# Patient Record
Sex: Male | Born: 1955 | Race: White | Hispanic: No | Smoking: Current every day smoker
Health system: Southern US, Community
[De-identification: ages and names within clinical notes are randomized; demographics above are authoritative.]

## PROBLEM LIST (undated history)

## (undated) DIAGNOSIS — Z973 Presence of spectacles and contact lenses: Secondary | ICD-10-CM

## (undated) DIAGNOSIS — K219 Gastro-esophageal reflux disease without esophagitis: Secondary | ICD-10-CM

## (undated) DIAGNOSIS — G43909 Migraine, unspecified, not intractable, without status migrainosus: Secondary | ICD-10-CM

## (undated) DIAGNOSIS — T753XXA Motion sickness, initial encounter: Secondary | ICD-10-CM

## (undated) DIAGNOSIS — C801 Malignant (primary) neoplasm, unspecified: Secondary | ICD-10-CM

## (undated) DIAGNOSIS — Z8489 Family history of other specified conditions: Secondary | ICD-10-CM

## (undated) DIAGNOSIS — I10 Essential (primary) hypertension: Secondary | ICD-10-CM

## (undated) HISTORY — PX: EXCISIONAL HEMORRHOIDECTOMY: SHX1541

## (undated) HISTORY — PX: PROSTATE SURGERY: SHX751

## (undated) HISTORY — PX: COLONOSCOPY: SHX174

## (undated) HISTORY — PX: HERNIA REPAIR: SHX51

---

## 2006-11-13 ENCOUNTER — Ambulatory Visit: Payer: Self-pay | Admitting: Emergency Medicine

## 2007-07-08 ENCOUNTER — Ambulatory Visit: Payer: Self-pay | Admitting: Internal Medicine

## 2007-07-22 ENCOUNTER — Ambulatory Visit: Payer: Self-pay | Admitting: General Practice

## 2008-07-10 DIAGNOSIS — C801 Malignant (primary) neoplasm, unspecified: Secondary | ICD-10-CM

## 2008-07-10 HISTORY — DX: Malignant (primary) neoplasm, unspecified: C80.1

## 2008-07-10 HISTORY — PX: PROSTATE SURGERY: SHX751

## 2008-07-30 ENCOUNTER — Ambulatory Visit: Payer: Self-pay | Admitting: Internal Medicine

## 2009-12-15 ENCOUNTER — Ambulatory Visit: Payer: Self-pay | Admitting: Surgery

## 2010-04-22 ENCOUNTER — Ambulatory Visit: Payer: Self-pay | Admitting: Unknown Physician Specialty

## 2010-04-25 LAB — PATHOLOGY REPORT

## 2010-05-19 ENCOUNTER — Observation Stay: Payer: Self-pay | Admitting: Internal Medicine

## 2010-05-30 ENCOUNTER — Emergency Department: Payer: Self-pay | Admitting: Emergency Medicine

## 2010-06-06 ENCOUNTER — Ambulatory Visit: Payer: Self-pay | Admitting: Surgery

## 2011-06-29 ENCOUNTER — Ambulatory Visit: Payer: Self-pay | Admitting: Urology

## 2011-08-03 ENCOUNTER — Ambulatory Visit: Payer: Self-pay | Admitting: Urology

## 2013-04-14 ENCOUNTER — Ambulatory Visit: Payer: Self-pay | Admitting: Family Medicine

## 2013-09-11 ENCOUNTER — Ambulatory Visit: Payer: Self-pay | Admitting: Otolaryngology

## 2015-01-28 ENCOUNTER — Ambulatory Visit
Admission: EM | Admit: 2015-01-28 | Discharge: 2015-01-28 | Disposition: A | Discharge status: Home / Self Care | Payer: 59 | Attending: Internal Medicine | Admitting: Internal Medicine

## 2015-01-28 ENCOUNTER — Encounter: Payer: Self-pay | Admitting: Registered Nurse

## 2015-01-28 DIAGNOSIS — F17210 Nicotine dependence, cigarettes, uncomplicated: Secondary | ICD-10-CM | POA: Insufficient documentation

## 2015-01-28 DIAGNOSIS — J309 Allergic rhinitis, unspecified: Secondary | ICD-10-CM | POA: Insufficient documentation

## 2015-01-28 DIAGNOSIS — R42 Dizziness and giddiness: Secondary | ICD-10-CM | POA: Diagnosis present

## 2015-01-28 DIAGNOSIS — I10 Essential (primary) hypertension: Secondary | ICD-10-CM | POA: Insufficient documentation

## 2015-01-28 DIAGNOSIS — G43909 Migraine, unspecified, not intractable, without status migrainosus: Secondary | ICD-10-CM | POA: Insufficient documentation

## 2015-01-28 DIAGNOSIS — H6593 Unspecified nonsuppurative otitis media, bilateral: Secondary | ICD-10-CM | POA: Diagnosis not present

## 2015-01-28 DIAGNOSIS — Z79899 Other long term (current) drug therapy: Secondary | ICD-10-CM | POA: Insufficient documentation

## 2015-01-28 HISTORY — DX: Malignant (primary) neoplasm, unspecified: C80.1

## 2015-01-28 HISTORY — DX: Essential (primary) hypertension: I10

## 2015-01-28 MED ORDER — SUMATRIPTAN SUCCINATE 6 MG/0.5ML ~~LOC~~ SOLN
6.0000 mg | Freq: Once | SUBCUTANEOUS | Status: AC
  Administered 2015-01-28: 6 mg via SUBCUTANEOUS

## 2015-01-28 NOTE — Discharge Instructions (Signed)
Next dose of sumatriptan in 12 hours at 0542am Rest, hydrate  Migraine Headache A migraine headache is an intense, throbbing pain on one or both sides of your head. A migraine can last for 30 minutes to several hours. CAUSES  The exact cause of a migraine headache is not always known. However, a migraine may be caused when nerves in the brain become irritated and release chemicals that cause inflammation. This causes pain. Certain things may also trigger migraines, such as:  Alcohol.  Smoking.  Stress.  Menstruation.  Aged cheeses.  Foods or drinks that contain nitrates, glutamate, aspartame, or tyramine.  Lack of sleep.  Chocolate.  Caffeine.  Hunger.  Physical exertion.  Fatigue.  Medicines used to treat chest pain (nitroglycerine), birth control pills, estrogen, and some blood pressure medicines. SIGNS AND SYMPTOMS  Pain on one or both sides of your head.  Pulsating or throbbing pain.  Severe pain that prevents daily activities.  Pain that is aggravated by any physical activity.  Nausea, vomiting, or both.  Dizziness.  Pain with exposure to bright lights, loud noises, or activity.  General sensitivity to bright lights, loud noises, or smells. Before you get a migraine, you may get warning signs that a migraine is coming (aura). An aura may include:  Seeing flashing lights.  Seeing bright spots, halos, or zigzag lines.  Having tunnel vision or blurred vision.  Having feelings of numbness or tingling.  Having trouble talking.  Having muscle weakness. DIAGNOSIS  A migraine headache is often diagnosed based on:  Symptoms.  Physical exam.  A CT scan or MRI of your head. These imaging tests cannot diagnose migraines, but they can help rule out other causes of headaches. TREATMENT Medicines may be given for pain and nausea. Medicines can also be given to help prevent recurrent migraines.  HOME CARE INSTRUCTIONS  Only take over-the-counter or  prescription medicines for pain or discomfort as directed by your health care provider. The use of long-term narcotics is not recommended.  Lie down in a dark, quiet room when you have a migraine.  Keep a journal to find out what may trigger your migraine headaches. For example, write down:  What you eat and drink.  How much sleep you get.  Any change to your diet or medicines.  Limit alcohol consumption.  Quit smoking if you smoke.  Get 7-9 hours of sleep, or as recommended by your health care provider.  Limit stress.  Keep lights dim if bright lights bother you and make your migraines worse. SEEK IMMEDIATE MEDICAL CARE IF:   Your migraine becomes severe.  You have a fever.  You have a stiff neck.  You have vision loss.  You have muscular weakness or loss of muscle control.  You start losing your balance or have trouble walking.  You feel faint or pass out.  You have severe symptoms that are different from your first symptoms. MAKE SURE YOU:   Understand these instructions.  Will watch your condition.  Will get help right away if you are not doing well or get worse. Document Released: 06/26/2005 Document Revised: 11/10/2013 Document Reviewed: 03/03/2013 [facility] Patient Information 2015 [place], [place]. This information is not intended to replace advice given to you by your health care provider. Make sure you discuss any questions you have with your health care provider. Hemorrhagic Stroke A hemorrhagic stroke occurs when a blood vessel in the brain leaks or bursts. Areas of the brain that should receive blood, oxygen, and nutrients from  the damaged blood vessel are deprived of blood flow. This causes areas of the brain to become damaged. Damage also occurs to areas of the brain where the leaked blood accumulates and presses on normal tissue. This is a medical emergency. This can cause permanent damage and loss of brain function. CAUSES  A hemorrhagic stroke is  caused by a decrease of oxygen supply to an area of your brain. It is the result of a blood vessel that leaks or ruptures. The leaking or rupturing blood vessel occurs due to one of the following conditions:  A ballooning of a weak section in a blood vessel (aneurysm).  Hardened, thin blood vessels. Blood vessel walls lined with plaque becoming thin and hardened. These hardened, thin artery walls can crack open and allow blood to flow out of the blood vessel.  An abnormal formation of a blood vessel (arteriovenous malformation). This condition results in an abnormal tangle of thin-walled blood vessels. Once the blood vessel ruptures, bleeding occurs. The blood from the ruptured blood vessel accumulates and compresses the surrounding brain tissue. Hemorrhagic strokes are classified as to the location of the bleed. If the bleeding occurs within the brain tissue, the condition is called an intracerebral hemorrhage. If the bleeding occurs in the area between the brain and the thin tissues that cover the brain, the condition is called a subarachnoid hemorrhage.  RISK FACTORS  High blood pressure (hypertension).  Abnormal blood vessels present since birth.  Bleeding disorders, such as hemophilia, sickle cell disease, or liver disease.  The blood becoming too thin while taking blood thinners (anticoagulants).  Smoking.  Excessive alcohol use.  Use of illicit drugs (especially cocaine or methamphetamine). SYMPTOMS   Sudden, severe headache with no known cause. The headache is often described as the worst headache ever experienced.  Nausea or vomiting, especially when combined with other symptoms such as a headache.  Sudden weakness or numbness of the face, arm, or leg, especially on one side of the body.  Sudden trouble walking or difficulty moving the legs.  Sudden confusion.  Sudden personality changes.  Trouble speaking (aphasia) or understanding.  Difficulty swallowing.  Sudden  trouble seeing in one or both eyes.  Double vision.  Dizziness.  Loss of balance or coordination.  Intolerance to light.  Stiff neck. DIAGNOSIS  Your health care provider will often suspect a hemorrhagic stroke based on your symptoms, history, and exam. A CT scan of the brain is usually performed. This is done to confirm the presence of bleeding in the brain, to look for causes, and to determine severity. Other tests may be done, including:  An MRI.  Angiography.  Blood tests. TREATMENT  The goals of treatment are to try to stop the bleeding, control pressure in the brain, and relieve symptoms.  Medicines may be given to:  Lower blood pressure (antihypertensives).  Relieve pain (analgesics).  Relieve nausea or vomiting.  Stop or prevent seizures.  Prevent the blood vessels in the brain from going into spasm in response to the presence of bleeding.  Other medicines, blood products, or vitamin K may also be given to control the bleeding.  If there is a collection of blood putting pressure on your brain, or if the blood vessel continues to bleed, surgery may be required.  Surgery may also be needed if tests reveal that there are other problems within the blood vessels of the brain that put you at an elevated risk for another bleeding event in the future. Further treatment depends  on the duration, severity, and cause of your symptoms. Physical, speech, and occupational therapists will assess you and work to improve any functions impaired by the stroke. Measures will be taken to prevent short-term and long-term complications, including infection from breathing foreign material into the lungs (aspiration pneumonia), blood clots in the legs, bedsores, and falls. HOME CARE INSTRUCTIONS   Take medicines only as instructed by your health care provider.  If swallow studies have determined that your swallowing reflex is present, you should eat healthy foods. Including 5 or more  servings of fruits and vegetables a day may reduce the risk of stroke. Foods may need to be a certain consistency (soft or pureed), or small bites may need to be taken in order to avoid aspirating or choking. Certain dietary changes may be advised to address high blood pressure, high cholesterol, diabetes, or obesity.  Food choices that are low in sodium, saturated fat, trans fat, and cholesterol are recommended to manage high blood pressure.  Food choies that are high in fiber, and low in saturated fat, trans fat, and cholesterol may control cholesterol levels.  Controlling carbohydrates and sugar intake is recommended to manage diabetes.  Reducing calorie intake and making food choices that are low in sodium, saturated fat, trans fat, and cholesterol are recommended to manage obesity.  Maintain a healthy weight.  Stay physically active. It is recommended that you get at least 30 minutes of activity on most or all days.  Limit alcohol use. Moderate alcohol use is considered to be:  No more than 2 drinks each day for men.  No more than 1 drink each day for nonpregnant women.  Stop drug abuse.  Manage any other health care conditions you may have, if applicable.  A safe home environment is important to reduce the risk of falls. Your health care provider may arrange for specialists to evaluate your home. Having grab bars in the bedroom and bathroom is often important. Your health care provider may arrange for special equipment to be used at home, such as raised toilets and a seat for the shower.  Physical, occupational, and speech therapy. Ongoing therapy may be needed to maximize your recovery after a stroke. If you have been advised to use a walker or a cane, use it at all times. Be sure to keep your therapy appointments.  Follow all instructions for follow-up with your health care provider. This is very important. This includes any referrals, physical therapy, rehabilitation, and  laboratory tests. Proper follow-up can prevent another stroke from occurring. SEEK MEDICAL CARE IF:  You have personality changes.  You have difficulty swallowing.  You are seeing double.  You have dizziness.  You have a fever.  You have skin breakdown. SEEK IMMEDIATE MEDICAL CARE IF:   You have a sudden, severe headache with no known cause.  You have sudden nausea or vomiting with a severe headache.  You have sudden weakness or numbness of the face, arm, or leg, especially on one side of the body.  You have sudden trouble walking or difficulty moving arms or legs.  You have sudden confusion.  You have trouble speaking (aphasia) or understanding.  You have sudden trouble seeing in one or both eyes.  You have a sudden loss of balance or coordination.  You have a stiff neck.  You have difficulty breathing.  You have a partial or total loss of consciousness. Any of these symptoms may represent a serious problem that is an emergency. Do not wait to  see if the symptoms will go away. Get medical help at once. Call your local emergency services (911 in [place] Do not drive yourself to the hospital. Document Released: 12/14/2009 Document Revised: 11/10/2013 Document Reviewed: 02/04/2012 [facility] Patient Information 2015 [place], [place]. This information is not intended to replace advice given to you by your health care provider. Make sure you discuss any questions you have with your health care provider. Ischemic Stroke A stroke (cerebrovascular accident) is the sudden death of brain tissue. It is a medical emergency. A stroke can cause permanent loss of brain function. This can cause problems with different parts of your body. A transient ischemic attack (TIA) is different because it does not cause permanent damage. A TIA is a short-lived problem of poor blood flow affecting a part of the brain. A TIA is also a serious problem because having a TIA greatly increases the chances of  having a stroke. When symptoms first develop, you cannot know if the problem might be a stroke or a TIA. CAUSES  A stroke is caused by a decrease of oxygen supply to an area of your brain. It is usually the result of a small blood clot or collection of cholesterol or fat (plaque) that blocks blood flow in the brain. A stroke can also be caused by blocked or damaged carotid arteries.  RISK FACTORS  High blood pressure (hypertension).  High cholesterol.  Diabetes mellitus.  Heart disease.  The buildup of plaque in the blood vessels (peripheral artery disease or atherosclerosis).  The buildup of plaque in the blood vessels providing blood and oxygen to the brain (carotid artery stenosis).  An abnormal heart rhythm (atrial fibrillation).  Obesity.  Smoking.  Taking oral contraceptives (especially in combination with smoking).  Physical inactivity.  A diet high in fats, salt (sodium), and calories.  Alcohol use.  Use of illegal drugs (especially cocaine and methamphetamine).  Being African American.  Being over the age of 65.  Family history of stroke.  Previous history of blood clots, stroke, TIA, or heart attack.  Sickle cell disease. SYMPTOMS  These symptoms usually develop suddenly, or may be newly present upon awakening from sleep:  Sudden weakness or numbness of the face, arm, or leg, especially on one side of the body.  Sudden trouble walking or difficulty moving arms or legs.  Sudden confusion.  Sudden personality changes.  Trouble speaking (aphasia) or understanding.  Difficulty swallowing.  Sudden trouble seeing in one or both eyes.  Double vision.  Dizziness.  Loss of balance or coordination.  Sudden severe headache with no known cause.  Trouble reading or writing. DIAGNOSIS  Your health care provider can often determine the presence or absence of a stroke based on your symptoms, history, and physical exam. Computed tomography (CT) of the  brain is usually performed to confirm the stroke, determine causes, and determine stroke severity. Other tests may be done to find the cause of the stroke. These tests may include:  Electrocardiography.  Continuous heart monitoring.  Echocardiography.  Carotid ultrasonography.  Magnetic resonance imaging (MRI).  A scan of the brain circulation.  Blood tests. PREVENTION  The risk of a stroke can be decreased by appropriately treating high blood pressure, high cholesterol, diabetes, heart disease, and obesity and by quitting smoking, limiting alcohol, and staying physically active. TREATMENT  Time is of the essence. It is important to seek treatment at the first sign of these symptoms because you may receive a medicine to dissolve the clot (thrombolytic) that cannot  be given if too much time has passed since your symptoms began. Even if you do not know when your symptoms began, get treatment as soon as possible as there are other treatment options available including oxygen, intravenous (IV) fluids, and medicines to thin the blood (anticoagulants). Treatment of stroke depends on the duration, severity, and cause of your symptoms. Medicines and dietary changes may be used to address diabetes, high blood pressure, and other risk factors. Physical, speech, and occupational therapists will assess you and work with you to improve any functions impaired by the stroke. Measures will be taken to prevent short-term and long-term complications, including infection from breathing foreign material into the lungs (aspiration pneumonia), blood clots in the legs, bedsores, and falls. Rarely, surgery may be needed to remove large blood clots or to open up blocked arteries. HOME CARE INSTRUCTIONS   Take medicines only as directed by your health care provider. Follow the directions carefully. Medicines may be used to control risk factors for a stroke. Be sure you understand all your medicine instructions.  You  may be told to take a medicine to thin the blood, such as aspirin or the anticoagulant warfarin. Warfarin needs to be taken exactly as instructed.  Too much and too little warfarin are both dangerous. Too much warfarin increases the risk of bleeding. Too little warfarin continues to allow the risk for blood clots. While taking warfarin, you will need to have regular blood tests to measure your blood clotting time. These blood tests usually include both the PT and INR tests. The PT and INR results allow your health care provider to adjust your dose of warfarin. The dose can change for many reasons. It is critically important that you take warfarin exactly as prescribed, and that you have your PT and INR levels drawn exactly as directed.  Many foods, especially foods high in vitamin K, can interfere with warfarin and affect the PT and INR results. Foods high in vitamin K include spinach, kale, broccoli, cabbage, collard and turnip greens, brussels sprouts, peas, cauliflower, seaweed, and parsley, as well as beef and pork liver, green tea, and soybean oil. You should eat a consistent amount of foods high in vitamin K. Avoid major changes in your diet, or notify your health care provider before changing your diet. Arrange a visit with a dietitian to answer your questions.  Many medicines can interfere with warfarin and affect the PT and INR results. You must tell your health care provider about any and all medicines you take. This includes all vitamins and supplements. Be especially cautious with aspirin and anti-inflammatory medicines. Do not take or discontinue any prescribed or over-the-counter medicine except on the advice of your health care provider or pharmacist.  Warfarin can have side effects, such as excessive bruising or bleeding. You will need to hold pressure over cuts for longer than usual. Your health care provider or pharmacist will discuss other potential side effects.  Avoid sports or  activities that may cause injury or bleeding.  Be mindful when shaving, flossing your teeth, or handling sharp objects.  Alcohol can change the body's ability to handle warfarin. It is best to avoid alcoholic drinks or consume only very small amounts while taking warfarin. Notify your health care provider if you change your alcohol intake.  Notify your dentist or other health care providers before procedures.  If swallow studies have determined that your swallowing reflex is present, you should eat healthy foods. Including 5 or more servings of fruits and  vegetables a day may reduce the risk of stroke. Foods may need to be a certain consistency (soft or pureed), or small bites may need to be taken in order to avoid aspirating or choking. Certain dietary changes may be advised to address high blood pressure, high cholesterol, diabetes, or obesity.  Food choices that are low in sodium, saturated fat, trans fat, and cholesterol are recommended to manage high blood pressure.  Food choies that are high in fiber, and low in saturated fat, trans fat, and cholesterol may control cholesterol levels.  Controlling carbohydrates and sugar intake is recommended to manage diabetes.  Reducing calorie intake and making food choices that are low in sodium, saturated fat, trans fat, and cholesterol are recommended to manage obesity.  Maintain a healthy weight.  Stay physically active. It is recommended that you get at least 30 minutes of activity on all or most days.  Do not use any tobacco products including cigarettes, chewing tobacco, or electronic cigarettes.  Limit alcohol use even if you are not taking warfarin. Moderate alcohol use is considered to be:  No more than 2 drinks each day for men.  No more than 1 drink each day for nonpregnant women.  Home safety. A safe home environment is important to reduce the risk of falls. Your health care provider may arrange for specialists to evaluate your  home. Having grab bars in the bedroom and bathroom is often important. Your health care provider may arrange for equipment to be used at home, such as raised toilets and a seat for the shower.  Physical, occupational, and speech therapy. Ongoing therapy may be needed to maximize your recovery after a stroke. If you have been advised to use a walker or a cane, use it at all times. Be sure to keep your therapy appointments.  Follow all instructions for follow-up with your health care provider. This is very important. This includes any referrals, physical therapy, rehabilitation, and lab tests. Proper follow-up can prevent another stroke from occurring. SEEK MEDICAL CARE IF:  You have personality changes.  You have difficulty swallowing.  You are seeing double.  You have dizziness.  You have a fever.  You have skin breakdown. SEEK IMMEDIATE MEDICAL CARE IF:  Any of these symptoms may represent a serious problem that is an emergency. Do not wait to see if the symptoms will go away. Get medical help right away. Call your local emergency services (911 in [place] Do not drive yourself to the hospital.  You have sudden weakness or numbness of the face, arm, or leg, especially on one side of the body.  You have sudden trouble walking or difficulty moving arms or legs.  You have sudden confusion.  You have trouble speaking (aphasia) or understanding.  You have sudden trouble seeing in one or both eyes.  You have a loss of balance or coordination.  You have a sudden, severe headache with no known cause.  You have new chest pain or an irregular heartbeat.  You have a partial or total loss of consciousness. Document Released: 06/26/2005 Document Revised: 11/10/2013 Document Reviewed: 02/04/2012 [facility] Patient Information 2015 [place], [place]. This information is not intended to replace advice given to you by your health care provider. Make sure you discuss any questions you have with your  health care provider. Transient Ischemic Attack A transient ischemic attack (TIA) is a "warning stroke" that causes stroke-like symptoms. Unlike a stroke, a TIA does not cause permanent damage to the brain. The symptoms of  a TIA can happen very fast and do not last long. It is important to know the symptoms of a TIA and what to do. This can help prevent a major stroke or death. CAUSES   A TIA is caused by a temporary blockage in an artery in the brain or neck (carotid artery). The blockage does not allow the brain to get the blood supply it needs and can cause different symptoms. The blockage can be caused by either:  A blood clot.  Fatty buildup (plaque) in a neck or brain artery. RISK FACTORS  High blood pressure (hypertension).  High cholesterol.  Diabetes mellitus.  Heart disease.  The build up of plaque in the blood vessels (peripheral artery disease or atherosclerosis).  The build up of plaque in the blood vessels providing blood and oxygen to the brain (carotid artery stenosis).  An abnormal heart rhythm (atrial fibrillation).  Obesity.  Smoking.  Taking oral contraceptives (especially in combination with smoking).  Physical inactivity.  A diet high in fats, salt (sodium), and calories.  Alcohol use.  Use of illegal drugs (especially cocaine and methamphetamine).  Being male.  Being African American.  Being over the age of 3.  Family history of stroke.  Previous history of blood clots, stroke, TIA, or heart attack.  Sickle cell disease. SYMPTOMS  TIA symptoms are the same as a stroke but are temporary. These symptoms usually develop suddenly, or may be newly present upon awakening from sleep:  Sudden weakness or numbness of the face, arm, or leg, especially on one side of the body.  Sudden trouble walking or difficulty moving arms or legs.  Sudden confusion.  Sudden personality changes.  Trouble speaking (aphasia) or understanding.  Difficulty  swallowing.  Sudden trouble seeing in one or both eyes.  Double vision.  Dizziness.  Loss of balance or coordination.  Sudden severe headache with no known cause.  Trouble reading or writing.  Loss of bowel or bladder control.  Loss of consciousness. DIAGNOSIS  Your caregiver may be able to determine the presence or absence of a TIA based on your symptoms, history, and physical exam. Computed tomography (CT scan) of the brain is usually performed to help identify a TIA. Other tests may be done to diagnose a TIA. These tests may include:  Electrocardiography.  Continuous heart monitoring.  Echocardiography.  Carotid ultrasonography.  Magnetic resonance imaging (MRI).  A scan of the brain circulation.  Blood tests. PREVENTION  The risk of a TIA can be decreased by appropriately treating high blood pressure, high cholesterol, diabetes, heart disease, and obesity and by quitting smoking, limiting alcohol, and staying physically active. TREATMENT  Time is of the essence. Since the symptoms of TIA are the same as a stroke, it is important to seek treatment as soon as possible because you may need a medicine to dissolve the clot (thrombolytic) that cannot be given if too much time has passed. Treatment options vary. Treatment options may include rest, oxygen, intravenous (IV) fluids, and medicines to thin the blood (anticoagulants). Medicines and diet may be used to address diabetes, high blood pressure, and other risk factors. Measures will be taken to prevent short-term and long-term complications, including infection from breathing foreign material into the lungs (aspiration pneumonia), blood clots in the legs, and falls. Treatment options include procedures to either remove plaque in the carotid arteries or dilate carotid arteries that have narrowed due to plaque. Those procedures are:  Carotid endarterectomy.  Carotid angioplasty and stenting. HOME CARE INSTRUCTIONS  Take  all medicines prescribed by your caregiver. Follow the directions carefully. Medicines may be used to control risk factors for a stroke. Be sure you understand all your medicine instructions.  You may be told to take aspirin or the anticoagulant warfarin. Warfarin needs to be taken exactly as instructed.  Taking too much or too little warfarin is dangerous. Too much warfarin increases the risk of bleeding. Too little warfarin continues to allow the risk for blood clots. While taking warfarin, you will need to have regular blood tests to measure your blood clotting time. A PT blood test measures how long it takes for blood to clot. Your PT is used to calculate another value called an INR. Your PT and INR help your caregiver to adjust your dose of warfarin. The dose can change for many reasons. It is critically important that you take warfarin exactly as prescribed.  Many foods, especially foods high in vitamin K can interfere with warfarin and affect the PT and INR. Foods high in vitamin K include spinach, kale, broccoli, cabbage, collard and turnip greens, brussels sprouts, peas, cauliflower, seaweed, and parsley as well as beef and pork liver, green tea, and soybean oil. You should eat a consistent amount of foods high in vitamin K. Avoid major changes in your diet, or notify your caregiver before changing your diet. Arrange a visit with a dietitian to answer your questions.  Many medicines can interfere with warfarin and affect the PT and INR. You must tell your caregiver about any and all medicines you take, this includes all vitamins and supplements. Be especially cautious with aspirin and anti-inflammatory medicines. Do not take or discontinue any prescribed or over-the-counter medicine except on the advice of your caregiver or pharmacist.  Warfarin can have side effects, such as excessive bruising or bleeding. You will need to hold pressure over cuts for longer than usual. Your caregiver or  pharmacist will discuss other potential side effects.  Avoid sports or activities that may cause injury or bleeding.  Be mindful when shaving, flossing your teeth, or handling sharp objects.  Alcohol can change the body's ability to handle warfarin. It is best to avoid alcoholic drinks or consume only very small amounts while taking warfarin. Notify your caregiver if you change your alcohol intake.  Notify your dentist or other caregivers before procedures.  Eat a diet that includes 5 or more servings of fruits and vegetables each day. This may reduce the risk of stroke. Certain diets may be prescribed to address high blood pressure, high cholesterol, diabetes, or obesity.  A low-sodium, low-saturated fat, low-trans fat, low-cholesterol diet is recommended to manage high blood pressure.  A low-saturated fat, low-trans fat, low-cholesterol, and high-fiber diet may control cholesterol levels.  A controlled-carbohydrate, controlled-sugar diet is recommended to manage diabetes.  A reduced-calorie, low-sodium, low-saturated fat, low-trans fat, low-cholesterol diet is recommended to manage obesity.  Maintain a healthy weight.  Stay physically active. It is recommended that you get at least 30 minutes of activity on most or all days.  Do not smoke.  Limit alcohol use even if you are not taking warfarin. Moderate alcohol use is considered to be:  No more than 2 drinks each day for men.  No more than 1 drink each day for nonpregnant women.  Stop drug abuse.  Home safety. A safe home environment is important to reduce the risk of falls. Your caregiver may arrange for specialists to evaluate your home. Having grab bars in the bedroom and bathroom is  often important. Your caregiver may arrange for equipment to be used at home, such as raised toilets and a seat for the shower.  Follow all instructions for follow-up with your caregiver. This is very important. This includes any referrals and  lab tests. Proper follow up can prevent a stroke or another TIA from occurring. SEEK MEDICAL CARE IF:  You have personality changes.  You have difficulty swallowing.  You are seeing double.  You have dizziness.  You have a fever.  You have skin breakdown. SEEK IMMEDIATE MEDICAL CARE IF:  Any of these symptoms may represent a serious problem that is an emergency. Do not wait to see if the symptoms will go away. Get medical help right away. Call your local emergency services (911 in [place] Do not drive yourself to the hospital.  You have sudden weakness or numbness of the face, arm, or leg, especially on one side of the body.  You have sudden trouble walking or difficulty moving arms or legs.  You have sudden confusion.  You have trouble speaking (aphasia) or understanding.  You have sudden trouble seeing in one or both eyes.  You have a loss of balance or coordination.  You have a sudden, severe headache with no known cause.  You have new chest pain or an irregular heartbeat.  You have a partial or total loss of consciousness. MAKE SURE YOU:   Understand these instructions.  Will watch your condition.  Will get help right away if you are not doing well or get worse. Document Released: 04/05/2005 Document Revised: 07/01/2013 Document Reviewed: 10/01/2013 [facility] Patient Information 2015 [place], [place]. This information is not intended to replace advice given to you by your health care provider. Make sure you discuss any questions you have with your health care provider.

## 2015-01-28 NOTE — ED Notes (Signed)
States had "dizzy spell" this morning. Occasional left lateral chest pain. + lightheaded and describes periods of TIA episodes over last 2 years. A&Ox3

## 2015-01-28 NOTE — ED Provider Notes (Signed)
CSN: [id number]     Arrival date & time 01/28/15  1649 History   First MD Initiated Contact with Patient 01/28/15 1729     Chief Complaint  Patient presents with  . 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  History of migraines that cause "spacing out", no pain in forehead, dysphasia (difficulty finding words/slow to answer questions".  Typically rests and takes imitrex nasal but did not have with him at work today.  Drove himself to urgent care and girlfriend met him here.  Stated current symptoms started about 30 minutes prior to arrival at urgent care.  First episode 0800 this am felt like vacuum, empty feeling in head at 0800 along with left foot numbness this am for a couple of seconds prior to eating breakfast of susage, egg, cheese and mayonnaise on toast and then going to work.  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  Has not eaten dinner yet tonight.  1PPD smoker since age 57.  Chronic back pain left low back and right hand dominant.  Denied arm/leg weakness.  Has reported right eye symptoms in the past with migraine of scintillating lights crescent pattern right eye but has not had those symptoms today  Hx prostate cancer/prostatectomy, HTN, GERD, tobacco use  Patient is a 59 y.o. male presenting with dizziness. The history is provided by the patient and a friend.  Dizziness Quality:  Lightheadedness Severity:  Mild Onset quality:  Sudden Duration:  1 hour Timing:  Intermittent Progression:  Resolved Chronicity:  Recurrent Context: not with bowel movement, not with ear pain, not with eye movement, not with inactivity, not with loss of consciousness and not with medication   Relieved by:  Lying down Worsened by:  Movement Associated symptoms: chest pain   Associated symptoms: no blood in stool, no  diarrhea, no headaches, no hearing loss, no nausea, no palpitations, no shortness of breath, no syncope, no tinnitus, no vision changes, no vomiting and no weakness   Risk factors: heart disease   Risk factors: no anemia, no hx of stroke, no hx of vertigo, no Meniere's disease, no multiple medications and no new medications     Past Medical History  Diagnosis Date  . Hypertension   . Cancer    Past Surgical History  Procedure Laterality Date  . Hernia repair     Family History  Problem Relation Age of Onset  . Parkinson's disease Father    History  Substance Use Topics  . Smoking status: Current Every Day Smoker -- 1.00 packs/day  . Smokeless tobacco: Not on file  . Alcohol Use: No    Review of Systems  Constitutional: Negative for fever, chills, diaphoresis, activity change, appetite change and fatigue.  HENT: Positive for postnasal drip. Negative for congestion, dental problem, drooling, ear discharge, ear pain, facial swelling, hearing loss, rhinorrhea, sinus pressure, sneezing, sore throat, tinnitus, trouble swallowing and voice change.   Eyes: Negative for photophobia, pain, discharge, redness, itching and visual disturbance.  Respiratory: Negative for cough, chest tightness, shortness of breath, wheezing and stridor.   Cardiovascular: Positive for chest pain. Negative for palpitations, leg swelling and syncope.  Gastrointestinal: Negative for nausea, vomiting, diarrhea and blood in stool.  Endocrine: Negative for cold intolerance and heat intolerance.  Genitourinary: Negative for dysuria and hematuria.  Musculoskeletal: Positive for myalgias and back pain. Negative for joint swelling, arthralgias, gait problem, neck pain and neck stiffness.  Skin: Negative for color change, pallor, rash and wound.  Allergic/Immunologic: Positive for environmental allergies. Negative for food allergies.  Neurological: Positive for speech difficulty and light-headedness. Negative for  dizziness, tremors, seizures, syncope, facial asymmetry, weakness, numbness and headaches.  Hematological: Negative for adenopathy. Does not bruise/bleed easily.  Psychiatric/Behavioral: Positive for decreased concentration. Negative for hallucinations, behavioral problems, confusion, sleep disturbance, self-injury, dysphoric mood and agitation. The patient is not nervous/anxious and is not hyperactive.     Allergies  Review of patient's allergies indicates no known allergies.  Home Medications   Prior to Admission medications   Medication Sig Start Date End Date Taking? Authorizing Provider  hydrochlorothiazide (HYDRODIURIL) 25 MG tablet Take 25 mg by mouth daily.   Yes Historical Provider, MD  SUMAtriptan (IMITREX) 5 MG/ACT nasal spray Place 1 spray into the nose every 2 (two) hours as needed for migraine.   Yes Historical Provider, MD   BP 152/88 mmHg  Pulse 69  Temp(Src) 98.4 F (36.9 C) (Tympanic)  Resp 16  Ht 5' 10"  (1.778 m)  Wt 189 lb (85.73 kg)  BMI 27.12 kg/m2  SpO2 96% Physical Exam  Constitutional: He is oriented to person, place, and time. Vital signs are normal. He appears well-developed and well-nourished. No distress.  HENT:  Head: Normocephalic and atraumatic.  Right Ear: Hearing, external ear and ear canal normal. A middle ear effusion is present.  Left Ear: Hearing, external ear and ear canal normal. A middle ear effusion is present.  Nose: Mucosal edema and rhinorrhea present. No nose lacerations, sinus tenderness or nasal deformity. No epistaxis.  No foreign bodies. Right sinus exhibits no maxillary sinus tenderness and no frontal sinus tenderness. Left sinus exhibits no maxillary sinus tenderness and no frontal sinus tenderness.  Mouth/Throat: Uvula is midline and mucous membranes are normal. Mucous membranes are not pale, not dry and not cyanotic. He does not have dentures. No oral lesions. No trismus in the jaw. Normal dentition. No dental abscesses, uvula  swelling, lacerations or dental caries. Posterior oropharyngeal edema and posterior oropharyngeal erythema present. No oropharyngeal exudate or tonsillar abscesses.  Eyes: Conjunctivae, EOM and lids are normal. Pupils are equal, round, and reactive to light. Right eye exhibits no discharge. Left eye exhibits no discharge. No scleral icterus.  Neck: Trachea normal and normal range of motion. Neck supple. No JVD present. No tracheal deviation present.  Cardiovascular: Normal rate, regular rhythm, normal heart sounds and intact distal pulses.  Exam reveals no gallop and no friction rub.   No murmur heard. Pulmonary/Chest: Effort normal and breath sounds normal. No accessory muscle usage or stridor. No respiratory distress. He has no decreased breath sounds. He has no wheezes. He has no rhonchi. He has no rales. He exhibits no tenderness.  Abdominal: Soft. Bowel sounds are normal. He exhibits no distension and no mass. There is no tenderness. There is no rebound and no guarding.  Musculoskeletal: Normal range of motion. He exhibits no edema or tenderness.       Cervical back: Normal.       Thoracic back: Normal.       Lumbar back: He exhibits pain. He exhibits normal range of motion, no tenderness, no swelling, no edema, no deformity, no laceration, no spasm and normal pulse.  Lymphadenopathy:    He has no cervical adenopathy.  Neurological: He is alert and oriented to person, place, and time. He has normal reflexes. He is not disoriented. He displays no atrophy, no tremor and normal reflexes. No cranial nerve  deficit or sensory deficit. He exhibits normal muscle tone. He displays no seizure activity. Coordination and gait normal. GCS eye subscore is 4. GCS verbal subscore is 5. GCS motor subscore is 6.  Reflex Scores:      Patellar reflexes are 2+ on the right side and 2+ on the left side.      Achilles reflexes are 2+ on the right side. Strength 5/5 all extremities equal bilaterally  Skin: Skin is  warm, dry and intact. No rash noted. He is not diaphoretic. No erythema. No pallor.  Psychiatric: He has a normal mood and affect. His mood appears not anxious. His affect is not angry, not blunt, not labile and not inappropriate. His speech is delayed and tangential. His speech is not rapid and/or pressured and not slurred. He is slowed. He is not agitated, not aggressive, not hyperactive, not withdrawn, not actively hallucinating and not combative. Thought content is not paranoid and not delusional. Cognition and memory are impaired. He does not express impulsivity or inappropriate judgment. He does not exhibit a depressed mood. He expresses no homicidal and no suicidal ideation. He expresses no suicidal plans and no homicidal plans. He is communicative.  Slow cognition with questionning for H&P today; up to 30 seconds for response or requires repeating question in normal voice and patient will then respond appropriately with requested information; girlfriend had to remind him where they went for lunch today but he remembered on own what he ate; speech clear He is attentive.  Nursing note and vitals reviewed.   ED Course  EKG  Date/Time: 01/28/2015 4:17 PM Performed by: Gerarda Fraction A Authorized by: Sherlene Shams Interpreted by ED physician Comparison: compared with previous ECG from 05/19/2010 Similar to previous ECG Comparison to previous ECG: Normal sinus rhythm, anteroseptal infarct age undetermined, low voltage qrs on 2011 70 BPM 146m PR interval 823mQRS and 04/28/2013 EKG anteroseptal infarct NSR 63 BPM QRS 9068mQT/QTc 422/432m45mis 50 -1 44 Rhythm: sinus rhythm Rate: normal QRS axis: normal Conduction: conduction normal ST Segments: ST segments normal T Waves: T waves normal Other: no other findings Clinical impression: normal ECG Comments: Vent rate 80BPM; PR interval 172ms2mS duration 82ms;86mQTc 384/442ms P25mxes 55 1 49 NSR, septal infarct age undetermined unchanged  from previous ekg dated 2011   (including critical care time) Labs Review Labs Reviewed - No data to display  Imaging Review No results found.  Medications  SUMAtriptan (IMITREX) injection 6 mg (6 mg Subcutaneous Given 01/28/15 1740)  by RN Lynne-AMarni Griffonarkened for patient and allowed to rest on gurney with girlfriend in room Filed Vitals:   01/28/15 1754  BP: 152/88  Pulse: 69  Temp:   Resp: 16   1815 re-evaluated patient, sitting on gurney reported feeling better.  Symptoms eased.  Patient reported had bout of nausea after my last evaluation/imitrex that has resolved.  Patient answering questions quicker, more wit to his answers now, requesting to be discharged to home as feeling better wants to eat dinner.  Reviewed signs/symptoms stroke and TIA again along with last time of imitrex dosing and reminded next dose can be given 0540 tomorrow with patient, girlfriend and daughter. Go to ER if aphasia, fall, lopsided smile, incoordination/weakness extremities, dysphagia, visual changes, dyspnea/worsening chest pain.   All verbalized understanding of information/instructions, agreed with plan of care and had no further questions at this time. MDM   1. Migraine without status migrainosus, not intractable, unspecified migraine type   2.  Allergic rhinitis, unspecified allergic rhinitis type   3. Otitis media with effusion, bilateral    For acute pain, rest, and intermittent application of heat, analgesics, and PRN po NSAIDS.  Next dose imitrex allowed 2111 am as subcutaneous administered in clinic at 1740.  Patient refused offer of zofran or Rx for zofran as hx nausea with migraine and had transient nausea that resolved in clinic.  Patient given copy of EKG and discussed results with patient stable from previous.  Avoid known triggers e.g. sleep deprivation, foods, stress, dehydration.  If headache is the worst headache of entire life and came on like a clap of thunder patient was  instructed to go to the Emergency Room.  Call or return to clinic as needed if these symptoms worsen or fail to improve as anticipated.  Exitcare handout on migraine given to patient and patient also instructed to maintain headache log.  Discussed signs/symptoms stroke and TIA with patient and spouse. Differential diagnosis: worsening migraine,  TIA, stroke. Discussed with patient, girlfriend and daughter Judson Roch.  Follow up immediately for re-evaluation at ER if aphasia, worsening chest pain, dysphagia, visual changes, weakness, fall, worst headache of life, incoordination, fever, ear discharge. Exitcare handout on migraines, stroke, TIA given to patient and girlfriend, daughter.  Daughter, girlfriend and Patient verbalized understanding of information/agreed with plan of care and had no further questions at this time.  Patient may use normal saline nasal spray as needed.  Consider antihistamine or nasal steroid use.  Avoid triggers if possible.  Shower prior to bedtime if exposed to triggers.  If allergic dust/dust mites recommend mattress/pillow covers/encasements; washing linens, vacuuming, sweeping, dusting weekly.  Call or return to clinic as needed if these symptoms worsen or fail to improve as anticipated.   Possible cause of migraine today--allergic trigger.   Patient and girlfriend verbalized understanding of instructions, agreed with plan of care and had no further questions at this time.  P2:  Avoidance and hand washing.  Supportive treatment.   No evidence of invasive bacterial infection, non toxic and well hydrated.  This is most likely self limiting viral infection.  I do not see where any further testing or imaging is necessary at this time.   I will suggest supportive care, rest, good hygiene and encourage the patient to take adequate fluids.  The patient is to return to clinic or EMERGENCY ROOM if symptoms worsen or change significantly e.g. ear pain, fever, purulent discharge from ears or  bleeding.   Patient verbalized agreement and understanding of treatment plan.     Olen Cordial, NP 01/28/15 1830

## 2015-10-04 ENCOUNTER — Emergency Department
Admission: EM | Admit: 2015-10-04 | Discharge: 2015-10-05 | Disposition: A | Discharge status: Home / Self Care | Payer: 59 | Attending: Emergency Medicine | Admitting: Emergency Medicine

## 2015-10-04 ENCOUNTER — Emergency Department: Payer: 59

## 2015-10-04 ENCOUNTER — Encounter: Payer: Self-pay | Admitting: Emergency Medicine

## 2015-10-04 DIAGNOSIS — R079 Chest pain, unspecified: Secondary | ICD-10-CM | POA: Insufficient documentation

## 2015-10-04 DIAGNOSIS — Z79899 Other long term (current) drug therapy: Secondary | ICD-10-CM | POA: Insufficient documentation

## 2015-10-04 DIAGNOSIS — F17200 Nicotine dependence, unspecified, uncomplicated: Secondary | ICD-10-CM | POA: Diagnosis not present

## 2015-10-04 DIAGNOSIS — I10 Essential (primary) hypertension: Secondary | ICD-10-CM | POA: Insufficient documentation

## 2015-10-04 LAB — BASIC METABOLIC PANEL
ANION GAP: 5 (ref 5–15)
BUN: 22 mg/dL — ABNORMAL HIGH (ref 6–20)
CO2: 24 mmol/L (ref 22–32)
Calcium: 9.1 mg/dL (ref 8.9–10.3)
Chloride: 107 mmol/L (ref 101–111)
Creatinine, Ser: 1.14 mg/dL (ref 0.61–1.24)
Glucose, Bld: 114 mg/dL — ABNORMAL HIGH (ref 65–99)
POTASSIUM: 4.1 mmol/L (ref 3.5–5.1)
SODIUM: 136 mmol/L (ref 135–145)

## 2015-10-04 LAB — CBC
HEMATOCRIT: 41 % (ref 40.0–52.0)
HEMOGLOBIN: 13.9 g/dL (ref 13.0–18.0)
MCH: 30.1 pg (ref 26.0–34.0)
MCHC: 33.9 g/dL (ref 32.0–36.0)
MCV: 88.8 fL (ref 80.0–100.0)
Platelets: 242 10*3/uL (ref 150–440)
RBC: 4.62 MIL/uL (ref 4.40–5.90)
RDW: 14.8 % — AB (ref 11.5–14.5)
WBC: 10.1 10*3/uL (ref 3.8–10.6)

## 2015-10-04 LAB — TROPONIN I: Troponin I: <0.03 ng/mL (ref <0.031)

## 2015-10-04 NOTE — ED Notes (Signed)
Pt reports chest pain radiating into back and arms - Legs feeling heavy and pt reports nausea - Pt reports feeling tired for no reason - Denies shortness of breath but is having a lot of burping

## 2015-10-04 NOTE — ED Provider Notes (Signed)
[facility] Emergency Department Provider Note  ____________________________________________  Time seen: 11:45 PM  I have reviewed the triage vital signs and the nursing notes.   HISTORY  Chief Complaint Chest Pain      HPI Dalton Boone is a 60 y.o. male past medical history of hypertension presents with intermittent central chest pain with radiation to the mid upper back and right arm onset at 4 PM this afternoon. Patient denies any shortness of breath. However does admit to lightheadedness. She denies any family history of cardiac disease-aneurysms DVT or PE.    Past Medical History  Diagnosis Date  . Hypertension   . Cancer ([place])     There are no active problems to display for this patient.   Past Surgical History  Procedure Laterality Date  . Hernia repair    . Prostate surgery    . Excisional hemorrhoidectomy      Current Outpatient Rx  Name  Route  Sig  Dispense  Refill  . hydrochlorothiazide (HYDRODIURIL) 25 MG tablet   Oral   Take 25 mg by mouth daily.         . SUMAtriptan (IMITREX) 5 MG/ACT nasal spray   Nasal   Place 1 spray into the nose every 2 (two) hours as needed for migraine.           Allergies No known drug allergies  Family History  Problem Relation Age of Onset  . Parkinson's disease Father     Social History Social History  Substance Use Topics  . Smoking status: Current Every Day Smoker -- 1.00 packs/day  . Smokeless tobacco: None  . Alcohol Use: No    Review of Systems  Constitutional: Negative for fever. Eyes: Negative for visual changes. ENT: Negative for sore throat. Cardiovascular: Positive for chest pain. Respiratory: Negative for shortness of breath. Gastrointestinal: Negative for abdominal pain, vomiting and diarrhea. Genitourinary: Negative for dysuria. Musculoskeletal: Negative for back pain. Skin: Negative for rash. Neurological: Negative for headaches, focal weakness or  numbness.   10-point ROS otherwise negative.  ____________________________________________   PHYSICAL EXAM:  VITAL SIGNS: ED Triage Vitals  Enc Vitals Group     BP 10/04/15 1825 149/96 mmHg     Pulse Rate 10/04/15 1825 86     Resp 10/04/15 1825 18     Temp 10/04/15 1825 99 F (37.2 C)     Temp Source 10/04/15 1825 Oral     SpO2 10/04/15 1825 95 %     Weight 10/04/15 1825 197 lb (89.359 kg)     Height 10/04/15 1825 5' 10.5" (1.791 m)     Head Cir --      Peak Flow --      Pain Score 10/04/15 1822 7     Pain Loc --      Pain Edu? --      Excl. in [place]? --      Constitutional: Alert and oriented. Well appearing and in no distress. Eyes: Conjunctivae are normal. PERRL. Normal extraocular movements. ENT   Head: Normocephalic and atraumatic.   Nose: No congestion/rhinnorhea.   Mouth/Throat: Mucous membranes are moist.   Neck: No stridor. Hematological/Lymphatic/Immunilogical: No cervical lymphadenopathy. Cardiovascular: Normal rate, regular rhythm. Normal and symmetric distal pulses are present in all extremities. No murmurs, rubs, or gallops. Respiratory: Normal respiratory effort without tachypnea nor retractions. Breath sounds are clear and equal bilaterally. No wheezes/rales/rhonchi. Gastrointestinal: Soft and nontender. No distention. There is no CVA tenderness. Genitourinary: deferred Musculoskeletal: Nontender with  normal range of motion in all extremities. No joint effusions.  No lower extremity tenderness nor edema. Neurologic:  Normal speech and language. No gross focal neurologic deficits are appreciated. Speech is normal.  Skin:  Skin is warm, dry and intact. No rash noted. Psychiatric: Mood and affect are normal. Speech and behavior are normal. Patient exhibits appropriate insight and judgment.  ____________________________________________    LABS (pertinent positives/negatives)  Labs Reviewed  BASIC METABOLIC PANEL - Abnormal; Notable for the  following:    Glucose, Bld 114 (*)    BUN 22 (*)    All other components within normal limits  CBC - Abnormal; Notable for the following:    RDW 14.8 (*)    All other components within normal limits  TROPONIN I  TROPONIN I  FIBRIN DERIVATIVES D-DIMER ([facility] ONLY)     ____________________________________________   EKG  ED ECG REPORT I, [place] N Zerick Prevette, the attending physician, personally viewed and interpreted this ECG.   Date: 10/04/2015  EKG Time: 6:21 PM  Rate: 91  Rhythm: Normal sinus rhythm  Axis: Normal  Intervals:normal   ST&T Change: None   ____________________________________________    RADIOLOGY     CT Angio Chest PE W/Cm &/Or Wo Cm (Final result) Result time: 10/05/15 02:02:01   Final result by Rad Results In Interface (10/05/15 02:02:01)   Narrative:   CLINICAL DATA: 60 year old male with chest pain  EXAM: CT ANGIOGRAPHY CHEST WITH CONTRAST  TECHNIQUE: Multidetector CT imaging of the chest was performed using the standard protocol during bolus administration of intravenous contrast. Multiplanar CT image reconstructions and MIPs were obtained to evaluate the vascular anatomy.  CONTRAST: 100 cc Isovue 3 7  COMPARISON: Chest radiograph dated 10/04/2015  FINDINGS: Bibasilar linear atelectasis/ scarring. The lungs are otherwise clear. There is no pleural effusion or pneumothorax. The central airways are patent.  The thoracic aorta appears unremarkable. No CT evidence of pulmonary embolism. There is no cardiomegaly or pericardial effusion. Mild pulmonary vascular calcification. Top-normal right hilar lymph node. There is no mediastinal adenopathy. The esophagus appears grossly unremarkable. Small right thyroid nodule.  There is no axillary adenopathy. The chest wall soft tissues appear unremarkable. There is mild degenerative changes of the spine. No acute fracture.  The visualized upper abdomen appears unremarkable. Small  hiatal hernia.  Review of the MIP images confirms the above findings.  IMPRESSION: No CT evidence of pulmonary embolism.   Electronically Signed By: Anner Crete M.D. On: 10/05/2015 02:02          DG Chest 2 View (Final result) Result time: 10/04/15 18:46:13   Final result by Rad Results In Interface (10/04/15 18:46:13)   Narrative:   CLINICAL DATA: Chest pain, shortness of breath.  EXAM: CHEST 2 VIEW  COMPARISON: April 14, 2013.  FINDINGS: The heart size and mediastinal contours are within normal limits. Both lungs are clear. No pneumothorax or pleural effusion is noted. The visualized skeletal structures are unremarkable.  IMPRESSION: No active cardiopulmonary disease.   Electronically Signed By: Marijo Conception, M.D. On: 10/04/2015 18:46       INITIAL IMPRESSION / ASSESSMENT AND PLAN / ED COURSE  Pertinent labs & imaging results that were available during my care of the patient were reviewed by me and considered in my medical decision making (see chart for details).  EKG evidence of ST segment elevation cardiac enzymes negative 2 CT scan of the chest revealed no gross abnormality. Concern for possible angina as etiology of the patient's chest pain as such  patient will be referred to Dr. Clayborn Bigness for outpatient evaluation and management. Patient is chest pain-free at this time  ____________________________________________   FINAL CLINICAL IMPRESSION(S) / ED DIAGNOSES  Final diagnoses:  Chest pain, unspecified chest pain type      Gregor Hams, MD 10/05/15 [phone]

## 2015-10-04 NOTE — ED Notes (Signed)
Pt presents to ED with new onset chest pain. Pt states radiates to his back. Pt reports lightheadedness and dizziness.

## 2015-10-04 NOTE — ED Notes (Signed)
IV;s d/c'd - documented on wrong chart

## 2015-10-05 ENCOUNTER — Emergency Department: Payer: 59

## 2015-10-05 LAB — FIBRIN DERIVATIVES D-DIMER (ARMC ONLY): FIBRIN DERIVATIVES D-DIMER (ARMC): 537 — AB (ref 0–499)

## 2015-10-05 LAB — TROPONIN I

## 2015-10-05 MED ORDER — IOPAMIDOL (ISOVUE-370) INJECTION 76%
100.0000 mL | Freq: Once | INTRAVENOUS | Status: AC | PRN
  Administered 2015-10-05: 100 mL via INTRAVENOUS

## 2015-10-05 NOTE — Discharge Instructions (Signed)
Nonspecific Chest Pain  °Chest pain can be caused by many different conditions. There is always a chance that your pain could be related to something serious, such as a heart attack or a blood clot in your lungs. Chest pain can also be caused by conditions that are not life-threatening. If you have chest pain, it is very important to follow up with your health care provider. °CAUSES  °Chest pain can be caused by: °· Heartburn. °· Pneumonia or bronchitis. °· Anxiety or stress. °· Inflammation around your heart (pericarditis) or lung (pleuritis or pleurisy). °· A blood clot in your lung. °· A collapsed lung (pneumothorax). It can develop suddenly on its own (spontaneous pneumothorax) or from trauma to the chest. °· Shingles infection (varicella-zoster virus). °· Heart attack. °· Damage to the bones, muscles, and cartilage that make up your chest wall. This can include: °¨ Bruised bones due to injury. °¨ Strained muscles or cartilage due to frequent or repeated coughing or overwork. °¨ Fracture to one or more ribs. °¨ Sore cartilage due to inflammation (costochondritis). °RISK FACTORS  °Risk factors for chest pain may include: °· Activities that increase your risk for trauma or injury to your chest. °· Respiratory infections or conditions that cause frequent coughing. °· Medical conditions or overeating that can cause heartburn. °· Heart disease or family history of heart disease. °· Conditions or health behaviors that increase your risk of developing a blood clot. °· Having had chicken pox (varicella zoster). °SIGNS AND SYMPTOMS °Chest pain can feel like: °· Burning or tingling on the surface of your chest or deep in your chest. °· Crushing, pressure, aching, or squeezing pain. °· Dull or sharp pain that is worse when you move, cough, or take a deep breath. °· Pain that is also felt in your back, neck, shoulder, or arm, or pain that spreads to any of these areas. °Your chest pain may come and go, or it may stay  constant. °DIAGNOSIS °Lab tests or other studies may be needed to find the cause of your pain. Your health care provider may have you take a test called an ambulatory ECG (electrocardiogram). An ECG records your heartbeat patterns at the time the test is performed. You may also have other tests, such as: °· Transthoracic echocardiogram (TTE). During echocardiography, sound waves are used to create a picture of all of the heart structures and to look at how blood flows through your heart. °· Transesophageal echocardiogram (TEE). This is a more advanced imaging test that obtains images from inside your body. It allows your health care provider to see your heart in finer detail. °· Cardiac monitoring. This allows your health care provider to monitor your heart rate and rhythm in real time. °· Holter monitor. This is a portable device that records your heartbeat and can help to diagnose abnormal heartbeats. It allows your health care provider to track your heart activity for several days, if needed. °· Stress tests. These can be done through exercise or by taking medicine that makes your heart beat more quickly. °· Blood tests. °· Imaging tests. °TREATMENT  °Your treatment depends on what is causing your chest pain. Treatment may include: °· Medicines. These may include: °¨ Acid blockers for heartburn. °¨ Anti-inflammatory medicine. °¨ Pain medicine for inflammatory conditions. °¨ Antibiotic medicine, if an infection is present. °¨ Medicines to dissolve blood clots. °¨ Medicines to treat coronary artery disease. °· Supportive care for conditions that do not require medicines. This may include: °¨ Resting. °¨ Applying heat   or cold packs to injured areas. °¨ Limiting activities until pain decreases. °HOME CARE INSTRUCTIONS °· If you were prescribed an antibiotic medicine, finish it all even if you start to feel better. °· Avoid any activities that bring on chest pain. °· Do not use any tobacco products, including  cigarettes, chewing tobacco, or electronic cigarettes. If you need help quitting, ask your health care provider. °· Do not drink alcohol. °· Take medicines only as directed by your health care provider. °· Keep all follow-up visits as directed by your health care provider. This is important. This includes any further testing if your chest pain does not go away. °· If heartburn is the cause for your chest pain, you may be told to keep your head raised (elevated) while sleeping. This reduces the chance that acid will go from your stomach into your esophagus. °· Make lifestyle changes as directed by your health care provider. These may include: °¨ Getting regular exercise. Ask your health care provider to suggest some activities that are safe for you. °¨ Eating a heart-healthy diet. A registered dietitian can help you to learn healthy eating options. °¨ Maintaining a healthy weight. °¨ Managing diabetes, if necessary. °¨ Reducing stress. °SEEK MEDICAL CARE IF: °· Your chest pain does not go away after treatment. °· You have a rash with blisters on your chest. °· You have a fever. °SEEK IMMEDIATE MEDICAL CARE IF:  °· Your chest pain is worse. °· You have an increasing cough, or you cough up blood. °· You have severe abdominal pain. °· You have severe weakness. °· You faint. °· You have chills. °· You have sudden, unexplained chest discomfort. °· You have sudden, unexplained discomfort in your arms, back, neck, or jaw. °· You have shortness of breath at any time. °· You suddenly start to sweat, or your skin gets clammy. °· You feel nauseous or you vomit. °· You suddenly feel light-headed or dizzy. °· Your heart begins to beat quickly, or it feels like it is skipping beats. °These symptoms may represent a serious problem that is an emergency. Do not wait to see if the symptoms will go away. Get medical help right away. Call your local emergency services (911 in the [place] Do not drive yourself to the hospital. °  °This  information is not intended to replace advice given to you by your health care provider. Make sure you discuss any questions you have with your health care provider. °  °Document Released: 04/05/2005 Document Revised: 07/17/2014 Document Reviewed: 01/30/2014 °Elsevier Interactive Patient Education ©2016 [employer] ° °

## 2016-07-13 ENCOUNTER — Other Ambulatory Visit: Payer: Self-pay | Admitting: Family Medicine

## 2016-07-13 ENCOUNTER — Ambulatory Visit
Admission: RE | Admit: 2016-07-13 | Discharge: 2016-07-13 | Disposition: A | Discharge status: Home / Self Care | Payer: 59 | Source: Ambulatory Visit | Attending: Family Medicine | Admitting: Family Medicine

## 2016-07-13 DIAGNOSIS — R05 Cough: Secondary | ICD-10-CM | POA: Diagnosis not present

## 2016-07-13 DIAGNOSIS — R053 Chronic cough: Secondary | ICD-10-CM

## 2016-11-15 ENCOUNTER — Telehealth: Payer: Self-pay | Admitting: *Deleted

## 2016-11-15 DIAGNOSIS — Z87891 Personal history of nicotine dependence: Secondary | ICD-10-CM

## 2016-11-15 NOTE — Telephone Encounter (Signed)
Received referral for initial lung cancer screening scan. Contacted patient and obtained smoking history,(current, 78 pack year) as well as answering questions related to screening process. Patient denies signs of lung cancer such as weight loss or hemoptysis. Patient denies comorbidity that would prevent curative treatment if lung cancer were found. Patient is scheduled for shared decision making visit and CT scan on 11/21/16.

## 2016-11-21 ENCOUNTER — Inpatient Hospital Stay: Payer: 59 | Attending: Oncology | Admitting: Oncology

## 2016-11-21 ENCOUNTER — Encounter: Payer: Self-pay | Admitting: Oncology

## 2016-11-21 ENCOUNTER — Ambulatory Visit
Admission: RE | Admit: 2016-11-21 | Discharge: 2016-11-21 | Disposition: A | Discharge status: Home / Self Care | Payer: 59 | Source: Ambulatory Visit | Attending: Oncology | Admitting: Oncology

## 2016-11-21 ENCOUNTER — Encounter (INDEPENDENT_AMBULATORY_CARE_PROVIDER_SITE_OTHER): Payer: Self-pay

## 2016-11-21 DIAGNOSIS — Z122 Encounter for screening for malignant neoplasm of respiratory organs: Secondary | ICD-10-CM

## 2016-11-21 DIAGNOSIS — Z87891 Personal history of nicotine dependence: Secondary | ICD-10-CM

## 2016-11-21 DIAGNOSIS — F17210 Nicotine dependence, cigarettes, uncomplicated: Secondary | ICD-10-CM

## 2016-11-21 NOTE — Progress Notes (Signed)
In accordance with CMS guidelines, patient has met eligibility criteria including age, absence of signs or symptoms of lung cancer.  Social History  Substance Use Topics  . Smoking status: Current Every Day Smoker    Packs/day: 2.00    Years: 39.00  . Smokeless tobacco: Not on file  . Alcohol use No     A shared decision-making session was conducted prior to the performance of CT scan. This includes one or more decision aids, includes benefits and harms of screening, follow-up diagnostic testing, over-diagnosis, false positive rate, and total radiation exposure.  Counseling on the importance of adherence to annual lung cancer LDCT screening, impact of co-morbidities, and ability or willingness to undergo diagnosis and treatment is imperative for compliance of the program.  Counseling on the importance of continued smoking cessation for former smokers; the importance of smoking cessation for current smokers, and information about tobacco cessation interventions have been given to patient including [place] and [facility] quit [place] programs.  Written order for lung cancer screening with LDCT has been given to the patient and any and all questions have been answered to the best of my abilities.   Yearly follow up will be coordinated by Burgess Estelle, Thoracic Navigator.

## 2016-11-23 ENCOUNTER — Encounter: Payer: Self-pay | Admitting: *Deleted

## 2017-05-08 ENCOUNTER — Encounter: Payer: Self-pay | Admitting: *Deleted

## 2017-05-08 ENCOUNTER — Ambulatory Visit (INDEPENDENT_AMBULATORY_CARE_PROVIDER_SITE_OTHER): Payer: 59

## 2017-05-08 ENCOUNTER — Ambulatory Visit
Admission: EM | Admit: 2017-05-08 | Discharge: 2017-05-08 | Disposition: A | Discharge status: Home / Self Care | Payer: 59 | Attending: Family Medicine | Admitting: Family Medicine

## 2017-05-08 DIAGNOSIS — M79602 Pain in left arm: Secondary | ICD-10-CM

## 2017-05-08 DIAGNOSIS — S46912A Strain of unspecified muscle, fascia and tendon at shoulder and upper arm level, left arm, initial encounter: Secondary | ICD-10-CM

## 2017-05-08 MED ORDER — CYCLOBENZAPRINE HCL 10 MG PO TABS: 10.0000 mg | ORAL_TABLET | Freq: Every day | ORAL | 0 refills | Status: DC

## 2017-05-08 MED ORDER — HYDROCODONE-ACETAMINOPHEN 5-325 MG PO TABS: ORAL_TABLET | ORAL | 0 refills | Status: DC

## 2017-05-08 NOTE — ED Provider Notes (Signed)
[facility]    CSN: [id number] Arrival date & time: 05/08/17  0944     History   Chief Complaint Chief Complaint  Patient presents with  . Arm Pain    HPI Dalton Boone is a 61 y.o. male.   61 yo male with a c/o constant left arm pain/ache for the past 2 days. Pain started after driving a motorcycle with higher handlebars for hours. Denies any falls or trauma, chest pains, shortness of breath, swelling, numbness/tingling.    The history is provided by the patient.    Past Medical History:  Diagnosis Date  . Cancer ([place])   . Hypertension     Patient Active Problem List   Diagnosis Date Noted  . Personal history of tobacco use, presenting hazards to health 11/21/2016    Past Surgical History:  Procedure Laterality Date  . EXCISIONAL HEMORRHOIDECTOMY    . HERNIA REPAIR    . PROSTATE SURGERY         Home Medications    Prior to Admission medications   Medication Sig Start Date End Date Taking? Authorizing Provider  hydrochlorothiazide (HYDRODIURIL) 25 MG tablet Take 25 mg by mouth daily.   Yes [provider]  Probiotic Product (PROBIOTIC DAILY PO) Take by mouth.   Yes [provider]  cyclobenzaprine (FLEXERIL) 10 MG tablet Take 1 tablet (10 mg total) by mouth at bedtime. 05/08/17   Norval Gable, MD  HYDROcodone-acetaminophen (NORCO/VICODIN) 5-325 MG tablet 1-2 tabs po q 8 hours prn 05/08/17   Norval Gable, MD  SUMAtriptan (IMITREX) 5 MG/ACT nasal spray Place 1 spray into the nose every 2 (two) hours as needed for migraine.    [provider]    Family History Family History  Problem Relation Age of Onset  . Parkinson's disease Father     Social History Social History  Substance Use Topics  . Smoking status: Current Every Day Smoker    Packs/day: 2.00    Years: 39.00  . Smokeless tobacco: Former Systems developer  . Alcohol use No     Allergies   Patient has no known allergies.   Review of Systems Review of  Systems   Physical Exam Triage Vital Signs ED Triage Vitals  Enc Vitals Group     BP 05/08/17 0959 (!) 135/98     Pulse Rate 05/08/17 0959 75     Resp 05/08/17 0959 16     Temp 05/08/17 0959 97.7 F (36.5 C)     Temp Source 05/08/17 0959 Oral     SpO2 05/08/17 0959 97 %     Weight 05/08/17 1001 200 lb (90.7 kg)     Height 05/08/17 1001 5\' 11"  (1.803 m)     Head Circumference --      Peak Flow --      Pain Score 05/08/17 1001 8     Pain Loc --      Pain Edu? --      Excl. in [place]? --    No data found.   Updated Vital Signs BP (!) 135/98 (BP Location: Right Arm)   Pulse 75   Temp 97.7 F (36.5 C) (Oral)   Resp 16   Ht 5\' 11"  (1.803 m)   Wt 200 lb (90.7 kg)   SpO2 97%   BMI 27.89 kg/m   Visual Acuity Right Eye Distance:   Left Eye Distance:   Bilateral Distance:    Right Eye Near:   Left Eye Near:  Bilateral Near:     Physical Exam  Constitutional: He appears well-developed and well-nourished. No distress.  Musculoskeletal: Normal range of motion. He exhibits tenderness.       Left shoulder: He exhibits tenderness (over the deltoid ) and pain. He exhibits normal range of motion, no bony tenderness, no swelling, no effusion, no crepitus, no deformity, no laceration, normal pulse and normal strength.  Skin: He is not diaphoretic.  Nursing note and vitals reviewed.    UC Treatments / Results  Labs (all labs ordered are listed, but only abnormal results are displayed) Labs Reviewed - No data to display  EKG  EKG Interpretation None       Radiology Dg Shoulder Left  Result Date: 05/08/2017 CLINICAL DATA:  Pain EXAM: LEFT SHOULDER - 2+ VIEW COMPARISON:  None. FINDINGS: Oblique, Y scapular, and axillary images were obtained. No evident fracture or dislocation. Joint spaces appear normal. No erosive change. Visualized left lung clear. IMPRESSION: No fracture or dislocation.  No evident arthropathy. Electronically Signed   By: Lowella Grip III M.D.    On: 05/08/2017 10:50    Procedures ED EKG Date/Time: 05/08/2017 12:21 PM Performed by: Norval Gable Authorized by: Norval Gable   ECG reviewed by ED Physician in the absence of a cardiologist: yes   Interpretation:    Interpretation: normal   Rate:    ECG rate assessment: normal   Rhythm:    Rhythm: sinus rhythm   Ectopy:    Ectopy: none   QRS:    QRS axis:  Normal Conduction:    Conduction: normal   ST segments:    ST segments:  Normal T waves:    T waves: normal     (including critical care time)  Medications Ordered in UC Medications - No data to display   Initial Impression / Assessment and Plan / UC Course  I have reviewed the triage vital signs and the nursing notes.  Pertinent labs & imaging results that were available during my care of the patient were reviewed by me and considered in my medical decision making (see chart for details).       Final Clinical Impressions(s) / UC Diagnoses   Final diagnoses:  Muscle strain of left upper arm, initial encounter    New Prescriptions Discharge Medication List as of 05/08/2017 11:02 AM    START taking these medications   Details  cyclobenzaprine (FLEXERIL) 10 MG tablet Take 1 tablet (10 mg total) by mouth at bedtime., Starting Tue 05/08/2017, Normal    HYDROcodone-acetaminophen (NORCO/VICODIN) 5-325 MG tablet 1-2 tabs po q 8 hours prn, Print       1. ekg/x-ray results and diagnosis reviewed with patient 2. rx as per orders above; reviewed possible side effects, interactions, risks and benefits  3. Recommend supportive treatment with otc analgesics, ice/heat, range of motion exercises 4. Follow-up prn if symptoms worsen or don't improve   Controlled Substance Prescriptions [place] Controlled Substance Registry consulted? Not Applicable   Norval Gable, MD 05/08/17 [phone]

## 2017-05-08 NOTE — ED Triage Notes (Signed)
Patient started having left arm constant pain 2 days ago. Mechanism of injury unknown.  No previous history of left arm surgery.

## 2017-11-17 ENCOUNTER — Telehealth: Payer: Self-pay | Admitting: *Deleted

## 2017-11-17 DIAGNOSIS — Z87891 Personal history of nicotine dependence: Secondary | ICD-10-CM

## 2017-11-17 DIAGNOSIS — Z122 Encounter for screening for malignant neoplasm of respiratory organs: Secondary | ICD-10-CM

## 2017-11-17 NOTE — Telephone Encounter (Signed)
Notified patient that annual lung cancer screening low dose CT scan is due currently or will be in near future. Confirmed that patient is within the age range of 55-77, and asymptomatic, (no signs or symptoms of lung cancer). Patient denies illness that would prevent curative treatment for lung cancer if found. Verified smoking history, (current, 79 pack year). The shared decision making visit was done 11/21/16. Patient is agreeable for CT scan being scheduled.

## 2017-11-23 ENCOUNTER — Other Ambulatory Visit: Payer: Self-pay | Admitting: Otolaryngology

## 2017-11-23 DIAGNOSIS — R42 Dizziness and giddiness: Secondary | ICD-10-CM

## 2017-11-23 DIAGNOSIS — H918X3 Other specified hearing loss, bilateral: Secondary | ICD-10-CM

## 2017-11-23 DIAGNOSIS — IMO0001 Reserved for inherently not codable concepts without codable children: Secondary | ICD-10-CM

## 2017-11-27 ENCOUNTER — Ambulatory Visit: Admission: RE | Admit: 2017-11-27 | Payer: 59 | Source: Ambulatory Visit

## 2017-11-27 ENCOUNTER — Ambulatory Visit
Admission: RE | Admit: 2017-11-27 | Discharge: 2017-11-27 | Disposition: A | Discharge status: Home / Self Care | Payer: 59 | Source: Ambulatory Visit | Attending: Oncology | Admitting: Oncology

## 2017-11-27 ENCOUNTER — Encounter (INDEPENDENT_AMBULATORY_CARE_PROVIDER_SITE_OTHER): Payer: Self-pay

## 2017-11-27 DIAGNOSIS — J432 Centrilobular emphysema: Secondary | ICD-10-CM | POA: Insufficient documentation

## 2017-11-27 DIAGNOSIS — I2510 Atherosclerotic heart disease of native coronary artery without angina pectoris: Secondary | ICD-10-CM | POA: Insufficient documentation

## 2017-11-27 DIAGNOSIS — Z87891 Personal history of nicotine dependence: Secondary | ICD-10-CM

## 2017-11-27 DIAGNOSIS — J438 Other emphysema: Secondary | ICD-10-CM | POA: Insufficient documentation

## 2017-11-27 DIAGNOSIS — I700 Atherosclerosis of aorta: Secondary | ICD-10-CM | POA: Insufficient documentation

## 2017-11-27 DIAGNOSIS — Z122 Encounter for screening for malignant neoplasm of respiratory organs: Secondary | ICD-10-CM | POA: Diagnosis not present

## 2017-11-30 ENCOUNTER — Encounter: Payer: Self-pay | Admitting: *Deleted

## 2017-12-05 ENCOUNTER — Other Ambulatory Visit
Admission: RE | Admit: 2017-12-05 | Discharge: 2017-12-05 | Disposition: A | Discharge status: Home / Self Care | Payer: 59 | Source: Ambulatory Visit | Attending: Otolaryngology | Admitting: Otolaryngology

## 2017-12-05 ENCOUNTER — Ambulatory Visit
Admission: RE | Admit: 2017-12-05 | Discharge: 2017-12-05 | Disposition: A | Discharge status: Home / Self Care | Payer: Commercial Managed Care - HMO | Source: Ambulatory Visit | Attending: Otolaryngology | Admitting: Otolaryngology

## 2017-12-05 DIAGNOSIS — H918X3 Other specified hearing loss, bilateral: Secondary | ICD-10-CM | POA: Insufficient documentation

## 2017-12-05 DIAGNOSIS — R42 Dizziness and giddiness: Secondary | ICD-10-CM

## 2017-12-05 DIAGNOSIS — H903 Sensorineural hearing loss, bilateral: Secondary | ICD-10-CM | POA: Diagnosis present

## 2017-12-05 DIAGNOSIS — IMO0001 Reserved for inherently not codable concepts without codable children: Secondary | ICD-10-CM

## 2017-12-05 LAB — CREATININE, SERUM: Creatinine, Ser: 0.88 mg/dL (ref 0.61–1.24)

## 2017-12-05 MED ORDER — GADOBENATE DIMEGLUMINE 529 MG/ML IV SOLN
20.0000 mL | Freq: Once | INTRAVENOUS | Status: AC | PRN
  Administered 2017-12-05: 19 mL via INTRAVENOUS

## 2018-01-29 ENCOUNTER — Encounter: Payer: Self-pay | Admitting: Gastroenterology

## 2018-01-29 ENCOUNTER — Other Ambulatory Visit: Payer: Self-pay

## 2018-01-29 ENCOUNTER — Ambulatory Visit: Payer: 59 | Admitting: Gastroenterology

## 2018-01-29 ENCOUNTER — Telehealth: Payer: Self-pay | Admitting: Gastroenterology

## 2018-01-29 VITALS — BP 154/98 | HR 82 | Resp 17 | Wt 195.2 lb

## 2018-01-29 DIAGNOSIS — K625 Hemorrhage of anus and rectum: Secondary | ICD-10-CM | POA: Diagnosis not present

## 2018-01-29 DIAGNOSIS — Z800 Family history of malignant neoplasm of digestive organs: Secondary | ICD-10-CM | POA: Insufficient documentation

## 2018-01-29 DIAGNOSIS — Z860101 Personal history of adenomatous and serrated colon polyps: Secondary | ICD-10-CM

## 2018-01-29 DIAGNOSIS — Z9889 Other specified postprocedural states: Secondary | ICD-10-CM | POA: Insufficient documentation

## 2018-01-29 DIAGNOSIS — Z86010 Personal history of colonic polyps: Secondary | ICD-10-CM

## 2018-01-29 DIAGNOSIS — K219 Gastro-esophageal reflux disease without esophagitis: Secondary | ICD-10-CM | POA: Diagnosis not present

## 2018-01-29 DIAGNOSIS — K635 Polyp of colon: Secondary | ICD-10-CM | POA: Insufficient documentation

## 2018-01-29 DIAGNOSIS — D122 Benign neoplasm of ascending colon: Secondary | ICD-10-CM

## 2018-01-29 NOTE — Telephone Encounter (Signed)
Patient called and is ready to schedule procedure. Would like [place] 03/09/18.

## 2018-01-29 NOTE — Progress Notes (Signed)
Cephas Darby, MD [address]  [place]  [place], [place] [address]  Main: [phone]  Fax: [phone]    Gastroenterology Consultation  Referring Provider:     Lynnell Jude, MD Primary Care Physician:  Lynnell Jude, MD Primary Gastroenterologist:  Dr. Cephas Darby Reason for Consultation:     Rectal bleeding, history of hemorrhoidectomy        HPI:   Dalton Boone is a 62 y.o. male referred by Dr. Clemmie Krill, Lynnell Jude, MD  for consultation & management of chronic intermittent rectal bleeding. Patient reports that he has been experiencing painless rectal bleeding for several years and underwent hemorrhoidectomy a few years ago. He reports the bleeding episodes has mild to moderate bright red blood per rectum, on wiping with almost every bowel movement and sometimes in the toilet bowel. He denies constipation or diarrhea. She tries to avoid spending more than 5 minutes on the toilet. 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 He has been taking pantoprazole for the last 2 years for history of heartburn.He does smoke cigarettes and working to quit, on nicotine patch  NSAIDs: none  Antiplts/Anticoagulants/Anti thrombotics: none  GI Procedures: colonoscopy in 2011, cecal polyp was tubular adenoma Mother with colon cancer in late 37s  Past Medical History:  Diagnosis Date  . Cancer ([place])   . Hypertension     Past Surgical History:  Procedure Laterality Date  . EXCISIONAL HEMORRHOIDECTOMY    . HERNIA REPAIR    . PROSTATE SURGERY      Current Outpatient Medications:  .  hydrochlorothiazide (HYDRODIURIL) 25 MG tablet, Take 25 mg by mouth daily., Disp: , Rfl:  .  lisinopril-hydrochlorothiazide (PRINZIDE,ZESTORETIC) 10-12.5 MG tablet, , Disp: , Rfl:  .  pantoprazole (PROTONIX) 40 MG tablet, , Disp: , Rfl:  .  SUMAtriptan (IMITREX) 5 MG/ACT nasal spray, Place 1 spray into the nose every 2 (two) hours as needed for migraine., Disp: , Rfl:    Family History  Problem Relation  Age of Onset  . Parkinson's disease Father      Social History   Tobacco Use  . Smoking status: Current Every Day Smoker    Packs/day: 2.00    Years: 39.00    Pack years: 78.00  . Smokeless tobacco: Former Network engineer Use Topics  . Alcohol use: No  . Drug use: No    Allergies as of 01/29/2018  . (No Known Allergies)    Review of Systems:    All systems reviewed and negative except where noted in HPI.   Physical Exam:  BP (!) 154/98 (BP Location: Left Arm, Patient Position: Sitting)   Pulse 82   Resp 17   Wt 195 lb 3.2 oz (88.5 kg)   BMI 27.22 kg/m  No LMP for male patient.  General:   Alert,  Well-developed, well-nourished, pleasant and cooperative in NAD Head:  Normocephalic and atraumatic. Eyes:  Sclera clear, no icterus.   Conjunctiva pink. Ears:  Normal auditory acuity. Nose:  No deformity, discharge, or lesions. Mouth:  No deformity or lesions,oropharynx pink & moist. Neck:  Supple; no masses or thyromegaly. Lungs:  Respirations even and unlabored.  Clear throughout to auscultation.   No wheezes, crackles, or rhonchi. No acute distress. Heart:  Regular rate and rhythm; no murmurs, clicks, rubs, or gallops. Abdomen:  Normal bowel sounds. Soft, non-tender and non-distended without masses, hepatosplenomegaly or hernias noted.  No guarding or rebound tenderness.   Rectal: Not performed Msk:  Symmetrical without gross deformities. Good, equal movement & strength bilaterally. Pulses:  Normal pulses noted. Extremities:  No clubbing or edema.  No cyanosis. Neurologic:  Alert and oriented x3;  grossly normal neurologically. Skin:  Intact without significant lesions or rashes. No jaundice. Lymph Nodes:  No significant cervical adenopathy. Psych:  Alert and cooperative. Normal mood and affect.  Imaging Studies: No abdominal imaging  Assessment and Plan:   Dalton Boone is a 62 y.o. Caucasian male with family history of colon cancer in first-degree relative,  personal history of colon adenoma, chronic GERD, intermittent painless rectal bleeding, history of hemorrhoidectomy  High risk for colon cancer: He is overdue for colonoscopy Discussed with him about colonoscopy to be scheduled within next 1-2 weeks, patient prefers to discuss with his wife look into her schedule and call our office back to schedule the procedure  Chronic GERD: Reports having had an EGD several years ago, records not available Continue Protonix 40 mg daily Recommend EGD with history of smoking and chronic GERD, screen for Barrett's  Rectal bleeding secondary to hemorrhoids Discussed with him about outpatient hemorrhoid ligation after the colonoscopy   Follow up after EGD and colonoscopy   Cephas Darby, MD

## 2018-01-29 NOTE — Telephone Encounter (Signed)
Patient has been scheduled for EGD and Colonoscopy on 03/09/18 @ [facility], pt has been notified

## 2018-01-30 ENCOUNTER — Encounter: Payer: Self-pay | Admitting: *Deleted

## 2018-01-30 ENCOUNTER — Other Ambulatory Visit: Payer: Self-pay

## 2018-01-30 DIAGNOSIS — K219 Gastro-esophageal reflux disease without esophagitis: Secondary | ICD-10-CM

## 2018-01-30 DIAGNOSIS — Z86010 Personal history of colonic polyps: Secondary | ICD-10-CM

## 2018-02-01 ENCOUNTER — Telehealth: Payer: Self-pay | Admitting: Gastroenterology

## 2018-02-01 NOTE — Telephone Encounter (Signed)
Darleen from Pre-service center left vm she statets [facility]  Is only covering code for procedure [phone] and there was another code that needs to be added code [phone] please call [place] to add the code or call Darleen with any questions cb [phone]

## 2018-02-06 ENCOUNTER — Encounter
Admission: RE | Disposition: A | Discharge status: Home / Self Care | Payer: Self-pay | Source: Ambulatory Visit | Attending: Gastroenterology

## 2018-02-06 ENCOUNTER — Telehealth: Payer: Self-pay | Admitting: Gastroenterology

## 2018-02-06 ENCOUNTER — Ambulatory Visit: Payer: Commercial Managed Care - HMO | Admitting: Anesthesiology

## 2018-02-06 ENCOUNTER — Ambulatory Visit
Admission: RE | Admit: 2018-02-06 | Discharge: 2018-02-06 | Disposition: A | Discharge status: Home / Self Care | Payer: Commercial Managed Care - HMO | Source: Ambulatory Visit | Attending: Gastroenterology | Admitting: Gastroenterology

## 2018-02-06 DIAGNOSIS — Z820 Family history of epilepsy and other diseases of the nervous system: Secondary | ICD-10-CM | POA: Diagnosis not present

## 2018-02-06 DIAGNOSIS — K5730 Diverticulosis of large intestine without perforation or abscess without bleeding: Secondary | ICD-10-CM | POA: Diagnosis not present

## 2018-02-06 DIAGNOSIS — G43909 Migraine, unspecified, not intractable, without status migrainosus: Secondary | ICD-10-CM | POA: Diagnosis not present

## 2018-02-06 DIAGNOSIS — R12 Heartburn: Secondary | ICD-10-CM | POA: Diagnosis not present

## 2018-02-06 DIAGNOSIS — K222 Esophageal obstruction: Secondary | ICD-10-CM | POA: Diagnosis not present

## 2018-02-06 DIAGNOSIS — K449 Diaphragmatic hernia without obstruction or gangrene: Secondary | ICD-10-CM | POA: Diagnosis not present

## 2018-02-06 DIAGNOSIS — Z800 Family history of malignant neoplasm of digestive organs: Secondary | ICD-10-CM | POA: Insufficient documentation

## 2018-02-06 DIAGNOSIS — Z1211 Encounter for screening for malignant neoplasm of colon: Secondary | ICD-10-CM | POA: Insufficient documentation

## 2018-02-06 DIAGNOSIS — K648 Other hemorrhoids: Secondary | ICD-10-CM | POA: Insufficient documentation

## 2018-02-06 DIAGNOSIS — Z79899 Other long term (current) drug therapy: Secondary | ICD-10-CM | POA: Insufficient documentation

## 2018-02-06 DIAGNOSIS — D120 Benign neoplasm of cecum: Secondary | ICD-10-CM | POA: Insufficient documentation

## 2018-02-06 DIAGNOSIS — Z86010 Personal history of colonic polyps: Secondary | ICD-10-CM

## 2018-02-06 DIAGNOSIS — Z860101 Personal history of adenomatous and serrated colon polyps: Secondary | ICD-10-CM

## 2018-02-06 DIAGNOSIS — K219 Gastro-esophageal reflux disease without esophagitis: Secondary | ICD-10-CM | POA: Diagnosis not present

## 2018-02-06 DIAGNOSIS — F17210 Nicotine dependence, cigarettes, uncomplicated: Secondary | ICD-10-CM | POA: Insufficient documentation

## 2018-02-06 DIAGNOSIS — R062 Wheezing: Secondary | ICD-10-CM | POA: Diagnosis not present

## 2018-02-06 DIAGNOSIS — I10 Essential (primary) hypertension: Secondary | ICD-10-CM | POA: Insufficient documentation

## 2018-02-06 HISTORY — DX: Motion sickness, initial encounter: T75.3XXA

## 2018-02-06 HISTORY — DX: Presence of spectacles and contact lenses: Z97.3

## 2018-02-06 HISTORY — PX: POLYPECTOMY: SHX5525

## 2018-02-06 HISTORY — DX: Family history of other specified conditions: Z84.89

## 2018-02-06 HISTORY — DX: Gastro-esophageal reflux disease without esophagitis: K21.9

## 2018-02-06 HISTORY — PX: COLONOSCOPY WITH PROPOFOL: SHX5780

## 2018-02-06 HISTORY — DX: Migraine, unspecified, not intractable, without status migrainosus: G43.909

## 2018-02-06 HISTORY — PX: ESOPHAGOGASTRODUODENOSCOPY (EGD) WITH PROPOFOL: SHX5813

## 2018-02-06 SURGERY — ESOPHAGOGASTRODUODENOSCOPY (EGD) WITH PROPOFOL
Anesthesia: General | Site: Rectum | Wound class: Clean Contaminated

## 2018-02-06 MED ORDER — ALBUTEROL SULFATE (2.5 MG/3ML) 0.083% IN NEBU
2.5000 mg | INHALATION_SOLUTION | Freq: Once | RESPIRATORY_TRACT | Status: AC
  Administered 2018-02-06: 2.5 mg via RESPIRATORY_TRACT

## 2018-02-06 MED ORDER — LACTATED RINGERS IV SOLN
INTRAVENOUS | Status: DC
  Administered 2018-02-06: 08:00:00 via INTRAVENOUS

## 2018-02-06 MED ORDER — GLYCOPYRROLATE 0.2 MG/ML IJ SOLN
INTRAMUSCULAR | Status: DC | PRN
  Administered 2018-02-06: .2 mg via INTRAVENOUS

## 2018-02-06 MED ORDER — PROPOFOL 10 MG/ML IV BOLUS
INTRAVENOUS | Status: DC | PRN
  Administered 2018-02-06: 30 mg via INTRAVENOUS
  Administered 2018-02-06: 20 mg via INTRAVENOUS
  Administered 2018-02-06: 100 mg via INTRAVENOUS
  Administered 2018-02-06: 30 mg via INTRAVENOUS
  Administered 2018-02-06 (×2): 20 mg via INTRAVENOUS
  Administered 2018-02-06: 30 mg via INTRAVENOUS
  Administered 2018-02-06: 40 mg via INTRAVENOUS
  Administered 2018-02-06 (×3): 20 mg via INTRAVENOUS
  Administered 2018-02-06: 30 mg via INTRAVENOUS
  Administered 2018-02-06: 20 mg via INTRAVENOUS
  Administered 2018-02-06: 30 mg via INTRAVENOUS

## 2018-02-06 MED ORDER — STERILE WATER FOR IRRIGATION IR SOLN
Status: DC | PRN
  Administered 2018-02-06: .5 mL

## 2018-02-06 MED ORDER — LACTATED RINGERS IV SOLN: INTRAVENOUS | Status: DC

## 2018-02-06 MED ORDER — ACETAMINOPHEN 160 MG/5ML PO SOLN: 325.0000 mg | Freq: Once | ORAL | Status: DC

## 2018-02-06 MED ORDER — DEXMEDETOMIDINE HCL 200 MCG/2ML IV SOLN
INTRAVENOUS | Status: DC | PRN
  Administered 2018-02-06: 6 ug via INTRAVENOUS

## 2018-02-06 MED ORDER — ACETAMINOPHEN 325 MG PO TABS: 325.0000 mg | ORAL_TABLET | Freq: Once | ORAL | Status: DC

## 2018-02-06 MED ORDER — SODIUM CHLORIDE 0.9 % IV SOLN: INTRAVENOUS | Status: DC

## 2018-02-06 MED ORDER — LIDOCAINE HCL (CARDIAC) PF 100 MG/5ML IV SOSY
PREFILLED_SYRINGE | INTRAVENOUS | Status: DC | PRN
  Administered 2018-02-06: 40 mg via INTRAVENOUS

## 2018-02-06 SURGICAL SUPPLY — 11 items
BLOCK BITE 60FR ADLT L/F GRN (MISCELLANEOUS) ×4 IMPLANT
CANISTER SUCT 1200ML W/VALVE (MISCELLANEOUS) ×4 IMPLANT
ELECT REM PT RETURN 9FT ADLT (ELECTROSURGICAL) ×4
ELECTRODE REM PT RTRN 9FT ADLT (ELECTROSURGICAL) ×3 IMPLANT
FORCEPS BIOP RAD 4 LRG CAP 4 (CUTTING FORCEPS) ×4 IMPLANT
GOWN CVR UNV OPN BCK APRN NK (MISCELLANEOUS) ×6 IMPLANT
GOWN ISOL THUMB LOOP REG UNIV (MISCELLANEOUS) ×2
KIT ENDO PROCEDURE OLY (KITS) ×4 IMPLANT
SNARE SHORT THROW 13M SML OVAL (MISCELLANEOUS) ×4 IMPLANT
TRAP ETRAP POLY (MISCELLANEOUS) ×4 IMPLANT
WATER STERILE IRR 250ML POUR (IV SOLUTION) ×4 IMPLANT

## 2018-02-06 NOTE — Anesthesia Preprocedure Evaluation (Signed)
Anesthesia Evaluation  Patient identified by MRN, date of birth, ID band Patient awake    Reviewed: Allergy & Precautions, H&P , NPO status , Patient's Chart, lab work & pertinent test results  Airway Mallampati: II  TM Distance: >3 FB Neck ROM: full    Dental no notable dental hx.    Pulmonary Current Smoker, former smoker,    Pulmonary exam normal + rhonchi  + wheezing      Cardiovascular hypertension, Normal cardiovascular exam Rhythm:regular Rate:Normal     Neuro/Psych    GI/Hepatic GERD  ,  Endo/Other    Renal/GU      Musculoskeletal   Abdominal   Peds  Hematology   Anesthesia Other Findings   Reproductive/Obstetrics                             Anesthesia Physical Anesthesia Plan  ASA: III  Anesthesia Plan: General   Post-op Pain Management:    Induction: Intravenous  PONV Risk Score and Plan: 2 and Propofol infusion and Treatment may vary due to age or medical condition  Airway Management Planned: Natural Airway  Additional Equipment:   Intra-op Plan:   Post-operative Plan:   Informed Consent: I have reviewed the patients History and Physical, chart, labs and discussed the procedure including the risks, benefits and alternatives for the proposed anesthesia with the patient or authorized representative who has indicated his/her understanding and acceptance.     Plan Discussed with: CRNA  Anesthesia Plan Comments:         Anesthesia Quick Evaluation

## 2018-02-06 NOTE — Op Note (Signed)
[facility] Gastroenterology Patient Name: Dalton Boone Procedure Date: 02/06/2018 7:39 AM MRN: [id number] Account #: [id number] Date of Birth: 06/27/1956 Admit Type: Outpatient Age: 62 Room: [facility] OR ROOM 01 Gender: Male Note Status: Finalized Procedure:            Upper GI endoscopy Indications:          Heartburn, Exclusion of Barrett's esophagus Providers:            Lin Landsman MD, MD Referring MD:         Lynnell Jude (Referring MD) Medicines:            Monitored Anesthesia Care Complications:        No immediate complications. Estimated blood loss: None. Procedure:            Pre-Anesthesia Assessment:                       - Prior to the procedure, a History and Physical was                        performed, and patient medications and allergies were                        reviewed. The patient is competent. The risks and                        benefits of the procedure and the sedation options and                        risks were discussed with the patient. All questions                        were answered and informed consent was obtained.                        Patient identification and proposed procedure were                        verified by the physician, the nurse, the                        anesthesiologist, the anesthetist and the technician in                        the pre-procedure area in the procedure room in the                        endoscopy suite. Mental Status Examination: alert and                        oriented. Airway Examination: normal oropharyngeal                        airway and neck mobility. Respiratory Examination:                        clear to auscultation. CV Examination: normal.                        Prophylactic Antibiotics: The patient does not require  prophylactic antibiotics. Prior Anticoagulants: The                        patient has taken no previous anticoagulant or                antiplatelet agents. ASA Grade Assessment: III - A                        patient with severe systemic disease. After reviewing                        the risks and benefits, the patient was deemed in                        satisfactory condition to undergo the procedure. The                        anesthesia plan was to use monitored anesthesia care                        (MAC). Immediately prior to administration of                        medications, the patient was re-assessed for adequacy                        to receive sedatives. The heart rate, respiratory rate,                        oxygen saturations, blood pressure, adequacy of                        pulmonary ventilation, and response to care were                        monitored throughout the procedure. The physical status                        of the patient was re-assessed after the procedure.                       After obtaining informed consent, the endoscope was                        passed under direct vision. Throughout the procedure,                        the patient's blood pressure, pulse, and oxygen                        saturations were monitored continuously. The was                        introduced through the mouth, and advanced to the                        second part of duodenum. The upper GI endoscopy was                        accomplished without difficulty. The patient  tolerated                        the procedure fairly well. Findings:      The duodenal bulb and second portion of the duodenum were normal.      A 1 cm hiatal hernia was present.      The stomach was normal.      The cardia and gastric fundus were normal on retroflexion.      A widely patent Schatzki ring was found at the gastroesophageal junction.      The gastroesophageal junction and examined esophagus were normal. Impression:           - Normal duodenal bulb and second portion of the                         duodenum.                       - 1 cm hiatal hernia.                       - Normal stomach.                       - Widely patent Schatzki ring.                       - Normal gastroesophageal junction and esophagus.                       - No specimens collected. Recommendation:       - Use a proton pump inhibitor PO daily.                       - Follow an antireflux regimen.                       - Proceed with colonoscopy as scheduled                       See colonoscopy report Procedure Code(s):    --- Professional ---                       [id number], Esophagogastroduodenoscopy, flexible, transoral;                        diagnostic, including collection of specimen(s) by                        brushing or washing, when performed (separate procedure) Diagnosis Code(s):    --- Professional ---                       K44.9, Diaphragmatic hernia without obstruction or                        gangrene                       K22.2, Esophageal obstruction                       R12, Heartburn CPT copyright 2017 American Medical Association. All rights reserved. The codes documented in this report are  preliminary and upon coder review may  be revised to meet current compliance requirements. Dr. Ulyess Mort Lin Landsman MD, MD 02/06/2018 8:34:22 AM This report has been signed electronically. Number of Addenda: 0 Note Initiated On: 02/06/2018 7:39 AM      [facility]

## 2018-02-06 NOTE — Anesthesia Postprocedure Evaluation (Signed)
Anesthesia Post Note  Patient: Dalton Boone  Procedure(s) Performed: ESOPHAGOGASTRODUODENOSCOPY (EGD) WITH PROPOFOL (N/A Mouth) COLONOSCOPY WITH PROPOFOL (N/A Rectum) POLYPECTOMY (Rectum)  Patient location during evaluation: PACU Anesthesia Type: General Level of consciousness: awake and alert and oriented Pain management: satisfactory to patient Vital Signs Assessment: post-procedure vital signs reviewed and stable Respiratory status: spontaneous breathing, nonlabored ventilation and respiratory function stable Cardiovascular status: blood pressure returned to baseline and stable Postop Assessment: Adequate PO intake and No signs of nausea or vomiting Anesthetic complications: no    Raliegh Ip

## 2018-02-06 NOTE — Transfer of Care (Signed)
Immediate Anesthesia Transfer of Care Note  Patient: Dalton Boone  Procedure(s) Performed: ESOPHAGOGASTRODUODENOSCOPY (EGD) WITH PROPOFOL (N/A Mouth) COLONOSCOPY WITH PROPOFOL (N/A Rectum) POLYPECTOMY (Rectum)  Patient Location: PACU  Anesthesia Type: General  Level of Consciousness: awake, alert  and patient cooperative  Airway and Oxygen Therapy: Patient Spontanous Breathing and Patient connected to supplemental oxygen  Post-op Assessment: Post-op Vital signs reviewed, Patient's Cardiovascular Status Stable, Respiratory Function Stable, Patent Airway and No signs of Nausea or vomiting  Post-op Vital Signs: Reviewed and stable  Complications: No apparent anesthesia complications

## 2018-02-06 NOTE — H&P (Signed)
Dalton Darby, MD [address]  [place]  [place], [place] [address]  Main: [phone]  Fax: [phone] Pager: [phone]  Primary Care Physician:  Lynnell Jude, MD Primary Gastroenterologist:  Dr. Cephas Boone  Pre-Procedure History & Physical: HPI:  Dalton Boone is a 62 y.o. male is here for an endoscopy and colonoscopy.   Past Medical History:  Diagnosis Date  . Cancer ([place])   . Family history of adverse reaction to anesthesia    mother - hard to wake  . GERD (gastroesophageal reflux disease)   . Hypertension   . Migraine headache    3-4x/yr  . Motion sickness    boats, back seat of cars  . Wears contact lenses     Past Surgical History:  Procedure Laterality Date  . COLONOSCOPY    . EXCISIONAL HEMORRHOIDECTOMY    . HERNIA REPAIR    . PROSTATE SURGERY      Prior to Admission medications   Medication Sig Start Date End Date Taking? Authorizing Provider  lisinopril-hydrochlorothiazide (PRINZIDE,ZESTORETIC) 10-12.5 MG tablet  10/31/17  Yes [provider]  pantoprazole (PROTONIX) 40 MG tablet  10/31/17  Yes [provider]  SUMAtriptan (IMITREX) 5 MG/ACT nasal spray Place 1 spray into the nose every 2 (two) hours as needed for migraine.   Yes [provider]    Allergies as of 01/30/2018  . (No Known Allergies)    Family History  Problem Relation Age of Onset  . Parkinson's disease Father     Social History   Socioeconomic History  . Marital status: Legally Separated    Spouse name: Not on file  . Number of children: Not on file  . Years of education: Not on file  . Highest education level: Not on file  Occupational History  . Not on file  Social Needs  . Financial resource strain: Not on file  . Food insecurity:    Worry: Not on file    Inability: Not on file  . Transportation needs:    Medical: Not on file    Non-medical: Not on file  Tobacco Use  . Smoking status: Current Every Day Smoker   Packs/day: 2.00    Years: 39.00    Pack years: 78.00  . Smokeless tobacco: Former Systems developer  . Tobacco comment: has cut back to 1/2 PPD(01/30/18)  Substance and Sexual Activity  . Alcohol use: No  . Drug use: No  . Sexual activity: Not on file  Lifestyle  . Physical activity:    Days per week: Not on file    Minutes per session: Not on file  . Stress: Not on file  Relationships  . Social connections:    Talks on phone: Not on file    Gets together: Not on file    Attends religious service: Not on file    Active member of club or organization: Not on file    Attends meetings of clubs or organizations: Not on file    Relationship status: Not on file  . Intimate partner violence:    Fear of current or ex partner: Not on file    Emotionally abused: Not on file    Physically abused: Not on file    Forced sexual activity: Not on file  Other Topics Concern  . Not on file  Social History Narrative  . Not on file    Review of Systems: See HPI, otherwise negative ROS  Physical Exam: BP (!) 121/94  Pulse 77   Temp 97.7 F (36.5 C) (Temporal)   Ht 5\' 11"  (1.803 m)   Wt 185 lb (83.9 kg)   SpO2 96%   BMI 25.80 kg/m  General:   Alert,  pleasant and cooperative in NAD Head:  Normocephalic and atraumatic. Neck:  Supple; no masses or thyromegaly. Lungs:  Clear throughout to auscultation.    Heart:  Regular rate and rhythm. Abdomen:  Soft, nontender and nondistended. Normal bowel sounds, without guarding, and without rebound.   Neurologic:  Alert and  oriented x4;  grossly normal neurologically.  Impression/Plan: Dalton Boone is here for an endoscopy and colonoscopy to be performed for GERD and colon cancer screening  Risks, benefits, limitations, and alternatives regarding  endoscopy and colonoscopy have been reviewed with the patient.  Questions have been answered.  All parties agreeable.   Sherri Sear, MD  02/06/2018, 8:12 AM

## 2018-02-06 NOTE — Anesthesia Procedure Notes (Signed)
Procedure Name: MAC Date/Time: 02/06/2018 8:20 AM Performed by: Janna Arch, CRNA Pre-anesthesia Checklist: Patient identified, Emergency Drugs available, Suction available and Patient being monitored Patient Re-evaluated:Patient Re-evaluated prior to induction Oxygen Delivery Method: Nasal cannula

## 2018-02-06 NOTE — Telephone Encounter (Signed)
Left vm for pt to call office and schedule 1 week apt for banding #1 with Dr. Marius Ditch

## 2018-02-06 NOTE — Op Note (Signed)
[facility] Gastroenterology Patient Name: Bear Osten Procedure Date: 02/06/2018 7:38 AM MRN: [id number] Account #: [id number] Date of Birth: 1955-10-22 Admit Type: Outpatient Age: 62 Room: [facility] OR ROOM 01 Gender: Male Note Status: Finalized Procedure:            Colonoscopy Indications:          Screening in patient at increased risk: Family history                        of 1st-degree relative with colorectal cancer before                        age 71 years, Last colonoscopy: October 2011 Providers:            Lin Landsman MD, MD Medicines:            Monitored Anesthesia Care Complications:        No immediate complications. Estimated blood loss: None. Procedure:            Pre-Anesthesia Assessment:                       - Prior to the procedure, a History and Physical was                        performed, and patient medications and allergies were                        reviewed. The patient is competent. The risks and                        benefits of the procedure and the sedation options and                        risks were discussed with the patient. All questions                        were answered and informed consent was obtained.                        Patient identification and proposed procedure were                        verified by the physician, the nurse, the                        anesthesiologist, the anesthetist and the technician in                        the pre-procedure area in the procedure room in the                        endoscopy suite. Mental Status Examination: alert and                        oriented. Airway Examination: normal oropharyngeal                        airway and neck mobility. Respiratory Examination:  clear to auscultation. CV Examination: normal.                        Prophylactic Antibiotics: The patient does not require                        prophylactic antibiotics. Prior  Anticoagulants: The                        patient has taken no previous anticoagulant or                        antiplatelet agents. ASA Grade Assessment: III - A                        patient with severe systemic disease. After reviewing                        the risks and benefits, the patient was deemed in                        satisfactory condition to undergo the procedure. The                        anesthesia plan was to use monitored anesthesia care                        (MAC). Immediately prior to administration of                        medications, the patient was re-assessed for adequacy                        to receive sedatives. The heart rate, respiratory rate,                        oxygen saturations, blood pressure, adequacy of                        pulmonary ventilation, and response to care were                        monitored throughout the procedure. The physical status                        of the patient was re-assessed after the procedure.                       After obtaining informed consent, the colonoscope was                        passed under direct vision. Throughout the procedure,                        the patient's blood pressure, pulse, and oxygen                        saturations were monitored continuously. The was  introduced through the anus and advanced to the the                        terminal ileum. The colonoscopy was performed without                        difficulty. The patient tolerated the procedure well.                        The quality of the bowel preparation was evaluated                        using the BBPS [facility] Bowel Preparation Scale) with                        scores of: Right Colon = 3, Transverse Colon = 3 and                        Left Colon = 3 (entire mucosa seen well with no                        residual staining, small fragments of stool or opaque                        liquid). The  total BBPS score equals 9. Findings:      The perianal and digital rectal examinations were normal. Pertinent       negatives include normal sphincter tone and no palpable rectal lesions.      A 4 mm polyp was found in the cecum. The polyp was sessile. The polyp       was removed with a cold biopsy forceps. Resection and retrieval were       complete.      A 6 mm polyp was found in the ileocecal valve. The polyp was sessile.       The polyp was removed with a hot snare. Resection and retrieval were       complete.      Multiple diverticula were found in the entire colon.      Non-bleeding internal hemorrhoids were found during retroflexion. The       hemorrhoids were medium-sized.      The terminal ileum appeared normal. Impression:           - One 4 mm polyp in the cecum, removed with a cold                        biopsy forceps. Resected and retrieved.                       - One 6 mm polyp at the ileocecal valve, removed with a                        hot snare. Resected and retrieved.                       - Severe diverticulosis in the entire examined colon.                       - Non-bleeding internal hemorrhoids. Recommendation:       -  Discharge patient to home (with escort).                       - Low sodium diet today.                       - Continue present medications.                       - Await pathology results.                       - Repeat colonoscopy in 5 years for surveillance.                       - Return to my office in 2 weeks. Procedure Code(s):    --- Professional ---                       [phone], Colonoscopy, flexible; with removal of tumor(s),                        polyp(s), or other lesion(s) by snare technique                       45380, 77, Colonoscopy, flexible; with biopsy, single                        or multiple Diagnosis Code(s):    --- Professional ---                       Z80.0, Family history of malignant neoplasm of                         digestive organs                       D12.0, Benign neoplasm of cecum                       K64.8, Other hemorrhoids                       K57.30, Diverticulosis of large intestine without                        perforation or abscess without bleeding CPT copyright 2017 American Medical Association. All rights reserved. The codes documented in this report are preliminary and upon coder review may  be revised to meet current compliance requirements. Dr. Ulyess Mort Lin Landsman MD, MD 02/06/2018 8:59:07 AM This report has been signed electronically. Number of Addenda: 0 Note Initiated On: 02/06/2018 7:38 AM Scope Withdrawal Time: 0 hours 16 minutes 25 seconds  Total Procedure Duration: 0 hours 18 minutes 53 seconds       [facility]

## 2018-02-07 ENCOUNTER — Encounter: Payer: Self-pay | Admitting: Gastroenterology

## 2018-02-08 ENCOUNTER — Encounter: Payer: Self-pay | Admitting: Gastroenterology

## 2018-02-15 ENCOUNTER — Ambulatory Visit: Payer: 59 | Admitting: Gastroenterology

## 2018-03-09 ENCOUNTER — Ambulatory Visit: Admit: 2018-03-09 | Payer: 59 | Admitting: Gastroenterology

## 2018-03-09 SURGERY — ESOPHAGOGASTRODUODENOSCOPY (EGD) WITH PROPOFOL
Anesthesia: Choice

## 2018-12-24 ENCOUNTER — Telehealth: Payer: Self-pay | Admitting: *Deleted

## 2018-12-24 DIAGNOSIS — Z122 Encounter for screening for malignant neoplasm of respiratory organs: Secondary | ICD-10-CM

## 2018-12-24 DIAGNOSIS — Z87891 Personal history of nicotine dependence: Secondary | ICD-10-CM

## 2018-12-24 NOTE — Telephone Encounter (Signed)
Patient has been notified that annual lung cancer screening low dose CT scan is due currently or will be in near future. Confirmed that patient is within the age range of 55-77, and asymptomatic, (no signs or symptoms of lung cancer). Patient denies illness that would prevent curative treatment for lung cancer if found. Verified smoking history, (current, 80.5 pack year). The shared decision making visit was done 11/21/16. Patient is agreeable for CT scan being scheduled.

## 2018-12-24 NOTE — Telephone Encounter (Signed)
Attempted to contact to schedule lung screening scan, however there is no answer or voicemail option.

## 2018-12-27 ENCOUNTER — Ambulatory Visit
Admission: RE | Admit: 2018-12-27 | Discharge: 2018-12-27 | Disposition: A | Discharge status: Home / Self Care | Payer: 59 | Source: Ambulatory Visit | Attending: Oncology | Admitting: Oncology

## 2018-12-27 ENCOUNTER — Other Ambulatory Visit: Payer: Self-pay

## 2018-12-27 DIAGNOSIS — Z122 Encounter for screening for malignant neoplasm of respiratory organs: Secondary | ICD-10-CM | POA: Insufficient documentation

## 2018-12-27 DIAGNOSIS — Z87891 Personal history of nicotine dependence: Secondary | ICD-10-CM | POA: Insufficient documentation

## 2018-12-30 ENCOUNTER — Encounter: Payer: Self-pay | Admitting: *Deleted

## 2019-02-10 ENCOUNTER — Other Ambulatory Visit: Payer: Self-pay

## 2019-02-10 ENCOUNTER — Ambulatory Visit
Admission: RE | Admit: 2019-02-10 | Discharge: 2019-02-10 | Disposition: A | Discharge status: Home / Self Care | Payer: 59 | Source: Ambulatory Visit | Attending: Family Medicine | Admitting: Family Medicine

## 2019-02-10 ENCOUNTER — Other Ambulatory Visit: Payer: Self-pay | Admitting: Family Medicine

## 2019-02-10 ENCOUNTER — Ambulatory Visit
Admission: RE | Admit: 2019-02-10 | Discharge: 2019-02-10 | Disposition: A | Discharge status: Home / Self Care | Payer: 59 | Attending: Family Medicine | Admitting: Family Medicine

## 2019-02-10 DIAGNOSIS — R109 Unspecified abdominal pain: Secondary | ICD-10-CM | POA: Insufficient documentation

## 2019-10-30 ENCOUNTER — Ambulatory Visit (INDEPENDENT_AMBULATORY_CARE_PROVIDER_SITE_OTHER): Payer: Medicare Other | Admitting: Surgery

## 2019-10-30 ENCOUNTER — Encounter: Payer: Self-pay | Admitting: Surgery

## 2019-10-30 ENCOUNTER — Other Ambulatory Visit: Payer: Self-pay

## 2019-10-30 ENCOUNTER — Ambulatory Visit: Payer: Self-pay | Admitting: Surgery

## 2019-10-30 VITALS — BP 155/100 | HR 79 | Temp 98.1°F | Resp 14 | Ht 70.0 in | Wt 202.0 lb

## 2019-10-30 DIAGNOSIS — K4090 Unilateral inguinal hernia, without obstruction or gangrene, not specified as recurrent: Secondary | ICD-10-CM

## 2019-10-30 NOTE — Patient Instructions (Signed)
You have chose to have your hernia repaired. This will be done by Dr.Rodenberg at the [facility] in [place].  Please see your (blue) Pre-care information that you have been given today. Our surgery scheduler will be in contact with you to schedule this and go over surgery information.   You will need to arrange to be out of work for 2 weeks and then return with a lifting restrictions for 4 more weeks. Please send any FMLA paperwork prior to surgery and we will fill this out and fax it back to your employer within 3 business days.  You may have a bruise in your groin and also swelling and brusing in your testicle area. You may use ice 4-5 times daily for 15-20 minutes each time. Make sure that you place a barrier between you and the ice pack. To decrease the swelling, you may roll up a bath towel and place it vertically in between your thighs with your testicles resting on the towel. You will want to keep this area elevated as much as possible for several days following surgery.    Inguinal Hernia, Adult Muscles help keep everything in the body in its proper place. But if a weak spot in the muscles develops, something can poke through. That is called a hernia. When this happens in the lower part of the belly (abdomen), it is called an inguinal hernia. (It takes its name from a part of the body in this region called the inguinal canal.) A weak spot in the wall of muscles lets some fat or part of the small intestine bulge through. An inguinal hernia can develop at any age. Men get them more often than women. CAUSES  In adults, an inguinal hernia develops over time.  It can be triggered by:  Suddenly straining the muscles of the lower abdomen.  Lifting heavy objects.  Straining to have a bowel movement. Difficult bowel movements (constipation) can lead to this.  Constant coughing. This may be caused by smoking or lung disease.  Being overweight.  Being pregnant.  Working at a job that  requires long periods of standing or heavy lifting.  Having had an inguinal hernia before. One type can be an emergency situation. It is called a strangulated inguinal hernia. It develops if part of the small intestine slips through the weak spot and cannot get back into the abdomen. The blood supply can be cut off. If that happens, part of the intestine may die. This situation requires emergency surgery. SYMPTOMS  Often, a small inguinal hernia has no symptoms. It is found when a healthcare provider does a physical exam. Larger hernias usually have symptoms.   In adults, symptoms may include:  A lump in the groin. This is easier to see when the person is standing. It might disappear when lying down.  In men, a lump in the scrotum.  Pain or burning in the groin. This occurs especially when lifting, straining or coughing.  A dull ache or feeling of pressure in the groin.  Signs of a strangulated hernia can include:  A bulge in the groin that becomes very painful and tender to the touch.  A bulge that turns red or purple.  Fever, nausea and vomiting.  Inability to have a bowel movement or to pass gas. DIAGNOSIS  To decide if you have an inguinal hernia, a healthcare provider will probably do a physical examination.  This will include asking questions about any symptoms you have noticed.  The healthcare  provider might feel the groin area and ask you to cough. If an inguinal hernia is felt, the healthcare provider may try to slide it back into the abdomen.  Usually no other tests are needed. TREATMENT  Treatments can vary. The size of the hernia makes a difference. Options include:  Watchful waiting. This is often suggested if the hernia is small and you have had no symptoms.  No medical procedure will be done unless symptoms develop.  You will need to watch closely for symptoms. If any occur, contact your healthcare provider right away.  Surgery. This is used if the hernia is  larger or you have symptoms.  Open surgery. This is usually an outpatient procedure (you will not stay overnight in a hospital). An cut (incision) is made through the skin in the groin. The hernia is put back inside the abdomen. The weak area in the muscles is then repaired by herniorrhaphy or hernioplasty. Herniorrhaphy: in this type of surgery, the weak muscles are sewn back together. Hernioplasty: a patch or mesh is used to close the weak area in the abdominal wall.  Laparoscopy. In this procedure, a surgeon makes small incisions. A thin tube with a tiny video camera (called a laparoscope) is put into the abdomen. The surgeon repairs the hernia with mesh by looking with the video camera and using two long instruments. HOME CARE INSTRUCTIONS   After surgery to repair an inguinal hernia:  You will need to take pain medicine prescribed by your healthcare provider. Follow all directions carefully.  You will need to take care of the wound from the incision.  Your activity will be restricted for awhile. This will probably include no heavy lifting for several weeks. You also should not do anything too active for a few weeks. When you can return to work will depend on the type of job that you have.  During "watchful waiting" periods, you should:  Maintain a healthy weight.  Eat a diet high in fiber (fruits, vegetables and whole grains).  Drink plenty of fluids to avoid constipation. This means drinking enough water and other liquids to keep your urine clear or pale yellow.  Do not lift heavy objects.  Do not stand for long periods of time.  Quit smoking. This should keep you from developing a frequent cough. SEEK MEDICAL CARE IF:   A bulge develops in your groin area.  You feel pain, a burning sensation or pressure in the groin. This might be worse if you are lifting or straining.  You develop a fever of more than 100.5 F (38.1 C). SEEK IMMEDIATE MEDICAL CARE IF:   Pain in the groin  increases suddenly.  A bulge in the groin gets bigger suddenly and does not go down.  For men, there is sudden pain in the scrotum. Or, the size of the scrotum increases.  A bulge in the groin area becomes red or purple and is painful to touch.  You have nausea or vomiting that does not go away.  You feel your heart beating much faster than normal.  You cannot have a bowel movement or pass gas.  You develop a fever of more than 102.0 F (38.9 C).   This information is not intended to replace advice given to you by your health care provider. Make sure you discuss any questions you have with your health care provider.   Document Released: 11/12/2008 Document Revised: 09/18/2011 Document Reviewed: 12/28/2014 Elsevier Interactive Patient Education [employer].

## 2019-10-30 NOTE — H&P (View-Only) (Signed)
Patient ID: Dalton Boone, male   DOB: 1956-03-15, 64 y.o.   MRN: [id number]  Chief Complaint: Right inguinal hernia  History of Present Illness Dalton Boone is a 64 y.o. male with progressive right groin pain with associated bulge.  Reports spontaneous reduction, exacerbated by lifting bags of mulch last week.  Prior history of umbilical hernia repair with mesh.  Denies significant cough despite smoking over a pack a day.  Has no known bulge or discomfort on the left side.  Prior robotic prostatectomy.  Denies any incarceration.  Past Medical History Past Medical History:  Diagnosis Date  . Cancer [facility]) 2010   prostate  . Family history of adverse reaction to anesthesia    mother - hard to wake  . GERD (gastroesophageal reflux disease)   . Hypertension   . Migraine headache    3-4x/yr  . Motion sickness    boats, back seat of cars  . Wears contact lenses       Past Surgical History:  Procedure Laterality Date  . COLONOSCOPY    . COLONOSCOPY WITH PROPOFOL N/A 02/06/2018   Procedure: COLONOSCOPY WITH PROPOFOL;  Surgeon: Lin Landsman, MD;  Location: [place];  Service: Endoscopy;  Laterality: N/A;  prefers early  . ESOPHAGOGASTRODUODENOSCOPY (EGD) WITH PROPOFOL N/A 02/06/2018   Procedure: ESOPHAGOGASTRODUODENOSCOPY (EGD) WITH PROPOFOL;  Surgeon: Lin Landsman, MD;  Location: [place];  Service: Endoscopy;  Laterality: N/A;  . EXCISIONAL HEMORRHOIDECTOMY    . HERNIA REPAIR     umbilical  . POLYPECTOMY  02/06/2018   Procedure: POLYPECTOMY;  Surgeon: Lin Landsman, MD;  Location: [place];  Service: Endoscopy;;  . PROSTATE SURGERY  2010   [facility]    No Known Allergies  Current Outpatient Medications  Medication Sig Dispense Refill  . budesonide-formoterol (SYMBICORT) 160-4.5 MCG/ACT inhaler Inhale 2 puffs into the lungs 2 (two) times daily.    Marland Kitchen lisinopril-hydrochlorothiazide (PRINZIDE,ZESTORETIC) 10-12.5 MG tablet     .  pantoprazole (PROTONIX) 40 MG tablet     . SUMAtriptan (IMITREX) 5 MG/ACT nasal spray Place 1 spray into the nose every 2 (two) hours as needed for migraine.     No current facility-administered medications for this visit.    Family History Family History  Problem Relation Age of Onset  . Parkinson's disease Father       Social History Social History   Tobacco Use  . Smoking status: Current Every Day Smoker    Packs/day: 1.50    Years: 50.00    Pack years: 75.00  . Smokeless tobacco: Former Network engineer Use Topics  . Alcohol use: Yes  . Drug use: No        Review of Systems  All other systems reviewed and are negative.     Physical Exam Blood pressure (!) 155/100, pulse 79, temperature 98.1 F (36.7 C), resp. rate 14, height 5\' 10"  (1.778 m), weight 202 lb (91.6 kg), SpO2 98 %. Last Weight  Most recent update: 10/30/2019 11:14 AM   Weight  91.6 kg (202 lb)            CONSTITUTIONAL: Well developed, and nourished, appropriately responsive and aware without distress.   EYES: Sclera non-icteric.   EARS, NOSE, MOUTH AND THROAT: Mask worn.   Hearing is intact to voice.  NECK: Trachea is midline, and there is no jugular venous distension.  LYMPH NODES:  Lymph nodes in the neck are not enlarged. RESPIRATORY:  Lungs are clear, and  breath sounds are equal bilaterally. Normal respiratory effort without pathologic use of accessory muscles. CARDIOVASCULAR: Heart is regular in rate and rhythm. GI: The abdomen is soft, nontender, and nondistended. There were no palpable masses. I did not appreciate hepatosplenomegaly. There were normal bowel sounds. GU: Readily apparent right groin hernia.  Despite attempts to control the pressure release on the right side I could not appreciate anything on the left. MUSCULOSKELETAL:  Symmetrical muscle tone appreciated in all four extremities.    SKIN: Skin turgor is normal. No pathologic skin lesions appreciated.  NEUROLOGIC:  Motor and  sensation appear grossly normal.  Cranial nerves are grossly without defect. PSYCH:  Alert and oriented to person, place and time. Affect is appropriate for situation.  Data Reviewed I have personally reviewed what is currently available of the patient's imaging, recent labs and medical records.   Labs:  CBC Latest Ref Rng & Units 10/04/2015  WBC 3.8 - 10.6 K/uL 10.1  Hemoglobin 13.0 - 18.0 g/dL 13.9  Hematocrit 40.0 - 52.0 % 41.0  Platelets 150 - 440 K/uL 242   CMP Latest Ref Rng & Units 12/05/2017 10/04/2015  Glucose 65 - 99 mg/dL - 114(H)  BUN 6 - 20 mg/dL - 22(H)  Creatinine 0.61 - 1.24 mg/dL 0.88 1.14  Sodium 135 - 145 mmol/L - 136  Potassium 3.5 - 5.1 mmol/L - 4.1  Chloride 101 - 111 mmol/L - 107  CO2 22 - 32 mmol/L - 24  Calcium 8.9 - 10.3 mg/dL - 9.1      Imaging:  Within last 24 hrs: No results found.  Assessment    Right inguinal hernia. Patient Active Problem List   Diagnosis Date Noted  . Chronic GERD   . History of adenomatous polyp of colon   . H/O hemorrhoidectomy 01/29/2018  . Rectal bleeding 01/29/2018  . Colon polyp 01/29/2018  . Family history of colon cancer in mother 01/29/2018  . Personal history of tobacco use, presenting hazards to health 11/21/2016    Plan    I discussed my preferences for proceeding with robotic hernia repair, however this patient prefers to have the surgery completed in [place].  And despite my recommendations he insists on having his procedure open and that this location.  I discussed possibility of incarceration, strangulation, enlargement in size over time, and the risk of emergency surgery in the face of strangulation.  Also discussed the risk of surgery including recurrence, use of prosthetic materials (mesh) and the increased risk of infxn, post-op infxn and the possible need for re-operation and removal of mesh if used, possibility of post-op SBO or ileus, and the risks of general anesthetic or reaction to anesthetic  medications. The patient understands the risks, any and all questions were answered to the patient's satisfaction.   Face-to-face time spent with the patient and accompanying care providers(if present) was 30 minutes, with more than 50% of the time spent counseling, educating, and coordinating care of the patient.      Ronny Bacon M.D., FACS 10/30/2019, 12:30 PM

## 2019-10-30 NOTE — Progress Notes (Signed)
Patient ID: Dalton Boone, male   DOB: 12/07/1955, 64 y.o.   MRN: [id number]  Chief Complaint: Right inguinal hernia  History of Present Illness Dalton Boone is a 64 y.o. male with progressive right groin pain with associated bulge.  Reports spontaneous reduction, exacerbated by lifting bags of mulch last week.  Prior history of umbilical hernia repair with mesh.  Denies significant cough despite smoking over a pack a day.  Has no known bulge or discomfort on the left side.  Prior robotic prostatectomy.  Denies any incarceration.  Past Medical History Past Medical History:  Diagnosis Date  . Cancer [facility]) 2010   prostate  . Family history of adverse reaction to anesthesia    mother - hard to wake  . GERD (gastroesophageal reflux disease)   . Hypertension   . Migraine headache    3-4x/yr  . Motion sickness    boats, back seat of cars  . Wears contact lenses       Past Surgical History:  Procedure Laterality Date  . COLONOSCOPY    . COLONOSCOPY WITH PROPOFOL N/A 02/06/2018   Procedure: COLONOSCOPY WITH PROPOFOL;  Surgeon: Lin Landsman, MD;  Location: [place];  Service: Endoscopy;  Laterality: N/A;  prefers early  . ESOPHAGOGASTRODUODENOSCOPY (EGD) WITH PROPOFOL N/A 02/06/2018   Procedure: ESOPHAGOGASTRODUODENOSCOPY (EGD) WITH PROPOFOL;  Surgeon: Lin Landsman, MD;  Location: [place];  Service: Endoscopy;  Laterality: N/A;  . EXCISIONAL HEMORRHOIDECTOMY    . HERNIA REPAIR     umbilical  . POLYPECTOMY  02/06/2018   Procedure: POLYPECTOMY;  Surgeon: Lin Landsman, MD;  Location: [place];  Service: Endoscopy;;  . PROSTATE SURGERY  2010   [facility]    No Known Allergies  Current Outpatient Medications  Medication Sig Dispense Refill  . budesonide-formoterol (SYMBICORT) 160-4.5 MCG/ACT inhaler Inhale 2 puffs into the lungs 2 (two) times daily.    Marland Kitchen lisinopril-hydrochlorothiazide (PRINZIDE,ZESTORETIC) 10-12.5 MG tablet     .  pantoprazole (PROTONIX) 40 MG tablet     . SUMAtriptan (IMITREX) 5 MG/ACT nasal spray Place 1 spray into the nose every 2 (two) hours as needed for migraine.     No current facility-administered medications for this visit.    Family History Family History  Problem Relation Age of Onset  . Parkinson's disease Father       Social History Social History   Tobacco Use  . Smoking status: Current Every Day Smoker    Packs/day: 1.50    Years: 50.00    Pack years: 75.00  . Smokeless tobacco: Former Network engineer Use Topics  . Alcohol use: Yes  . Drug use: No        Review of Systems  All other systems reviewed and are negative.     Physical Exam Blood pressure (!) 155/100, pulse 79, temperature 98.1 F (36.7 C), resp. rate 14, height 5\' 10"  (1.778 m), weight 202 lb (91.6 kg), SpO2 98 %. Last Weight  Most recent update: 10/30/2019 11:14 AM   Weight  91.6 kg (202 lb)            CONSTITUTIONAL: Well developed, and nourished, appropriately responsive and aware without distress.   EYES: Sclera non-icteric.   EARS, NOSE, MOUTH AND THROAT: Mask worn.   Hearing is intact to voice.  NECK: Trachea is midline, and there is no jugular venous distension.  LYMPH NODES:  Lymph nodes in the neck are not enlarged. RESPIRATORY:  Lungs are clear, and  breath sounds are equal bilaterally. Normal respiratory effort without pathologic use of accessory muscles. CARDIOVASCULAR: Heart is regular in rate and rhythm. GI: The abdomen is soft, nontender, and nondistended. There were no palpable masses. I did not appreciate hepatosplenomegaly. There were normal bowel sounds. GU: Readily apparent right groin hernia.  Despite attempts to control the pressure release on the right side I could not appreciate anything on the left. MUSCULOSKELETAL:  Symmetrical muscle tone appreciated in all four extremities.    SKIN: Skin turgor is normal. No pathologic skin lesions appreciated.  NEUROLOGIC:  Motor and  sensation appear grossly normal.  Cranial nerves are grossly without defect. PSYCH:  Alert and oriented to person, place and time. Affect is appropriate for situation.  Data Reviewed I have personally reviewed what is currently available of the patient's imaging, recent labs and medical records.   Labs:  CBC Latest Ref Rng & Units 10/04/2015  WBC 3.8 - 10.6 K/uL 10.1  Hemoglobin 13.0 - 18.0 g/dL 13.9  Hematocrit 40.0 - 52.0 % 41.0  Platelets 150 - 440 K/uL 242   CMP Latest Ref Rng & Units 12/05/2017 10/04/2015  Glucose 65 - 99 mg/dL - 114(H)  BUN 6 - 20 mg/dL - 22(H)  Creatinine 0.61 - 1.24 mg/dL 0.88 1.14  Sodium 135 - 145 mmol/L - 136  Potassium 3.5 - 5.1 mmol/L - 4.1  Chloride 101 - 111 mmol/L - 107  CO2 22 - 32 mmol/L - 24  Calcium 8.9 - 10.3 mg/dL - 9.1      Imaging:  Within last 24 hrs: No results found.  Assessment    Right inguinal hernia. Patient Active Problem List   Diagnosis Date Noted  . Chronic GERD   . History of adenomatous polyp of colon   . H/O hemorrhoidectomy 01/29/2018  . Rectal bleeding 01/29/2018  . Colon polyp 01/29/2018  . Family history of colon cancer in mother 01/29/2018  . Personal history of tobacco use, presenting hazards to health 11/21/2016    Plan    I discussed my preferences for proceeding with robotic hernia repair, however this patient prefers to have the surgery completed in [place].  And despite my recommendations he insists on having his procedure open and that this location.  I discussed possibility of incarceration, strangulation, enlargement in size over time, and the risk of emergency surgery in the face of strangulation.  Also discussed the risk of surgery including recurrence, use of prosthetic materials (mesh) and the increased risk of infxn, post-op infxn and the possible need for re-operation and removal of mesh if used, possibility of post-op SBO or ileus, and the risks of general anesthetic or reaction to anesthetic  medications. The patient understands the risks, any and all questions were answered to the patient's satisfaction.   Face-to-face time spent with the patient and accompanying care providers(if present) was 30 minutes, with more than 50% of the time spent counseling, educating, and coordinating care of the patient.      Ronny Bacon M.D., FACS 10/30/2019, 12:30 PM

## 2019-11-03 ENCOUNTER — Telehealth: Payer: Self-pay | Admitting: Surgery

## 2019-11-03 NOTE — Telephone Encounter (Signed)
Pt has been advised of Pre-Admission date/time, COVID Testing date and Surgery date.  Surgery Date: 11/13/19@ [facility]  Preadmission Testing Date: Per Kim @ [place] a nurse will be in touch w/the patient to go over preadmission screening.  No date or time was given for this.   Covid Testing Date: 11/11/19 - patient advised to go to the [facility] ([place]) between 8a-1p  Patient has been made aware that a nurse will call him from [facility] to go over preadmit and tell him what time to arrive day of surgery.

## 2019-11-03 NOTE — Telephone Encounter (Signed)
See previous telephone call encounter

## 2019-11-05 ENCOUNTER — Other Ambulatory Visit: Payer: Self-pay

## 2019-11-05 ENCOUNTER — Encounter: Payer: Self-pay | Admitting: Surgery

## 2019-11-06 ENCOUNTER — Inpatient Hospital Stay: Admission: RE | Admit: 2019-11-06 | Payer: Medicare Other | Source: Ambulatory Visit

## 2019-11-11 ENCOUNTER — Other Ambulatory Visit
Admission: RE | Admit: 2019-11-11 | Discharge: 2019-11-11 | Disposition: A | Discharge status: Home / Self Care | Payer: Medicare Other | Source: Ambulatory Visit | Attending: Surgery | Admitting: Surgery

## 2019-11-11 ENCOUNTER — Other Ambulatory Visit: Payer: Self-pay

## 2019-11-11 DIAGNOSIS — Z20822 Contact with and (suspected) exposure to covid-19: Secondary | ICD-10-CM | POA: Diagnosis not present

## 2019-11-11 DIAGNOSIS — Z01812 Encounter for preprocedural laboratory examination: Secondary | ICD-10-CM | POA: Insufficient documentation

## 2019-11-11 LAB — SARS CORONAVIRUS 2 (TAT 6-24 HRS): SARS Coronavirus 2: NEGATIVE

## 2019-11-12 NOTE — Discharge Instructions (Signed)
General Anesthesia, Adult, Care After This sheet gives you information about how to care for yourself after your procedure. Your health care provider may also give you more specific instructions. If you have problems or questions, contact your health care provider. What can I expect after the procedure? After the procedure, the following side effects are common:  Pain or discomfort at the IV site.  Nausea.  Vomiting.  Sore throat.  Trouble concentrating.  Feeling cold or chills.  Weak or tired.  Sleepiness and fatigue.  Soreness and body aches. These side effects can affect parts of the body that were not involved in surgery. Follow these instructions at home:  For at least 24 hours after the procedure:  Have a responsible adult stay with you. It is important to have someone help care for you until you are awake and alert.  Rest as needed.  Do not: ? Participate in activities in which you could fall or become injured. ? Drive. ? Use heavy machinery. ? Drink alcohol. ? Take sleeping pills or medicines that cause drowsiness. ? Make important decisions or sign legal documents. ? Take care of children on your own. Eating and drinking  Follow any instructions from your health care provider about eating or drinking restrictions.  When you feel hungry, start by eating small amounts of foods that are soft and easy to digest (bland), such as toast. Gradually return to your regular diet.  Drink enough fluid to keep your urine pale yellow.  If you vomit, rehydrate by drinking water, juice, or clear broth. General instructions  If you have sleep apnea, surgery and certain medicines can increase your risk for breathing problems. Follow instructions from your health care provider about wearing your sleep device: ? Anytime you are sleeping, including during daytime naps. ? While taking prescription pain medicines, sleeping medicines, or medicines that make you drowsy.  Return to  your normal activities as told by your health care provider. Ask your health care provider what activities are safe for you.  Take over-the-counter and prescription medicines only as told by your health care provider.  If you smoke, do not smoke without supervision.  Keep all follow-up visits as told by your health care provider. This is important. Contact a health care provider if:  You have nausea or vomiting that does not get better with medicine.  You cannot eat or drink without vomiting.  You have pain that does not get better with medicine.  You are unable to pass urine.  You develop a skin rash.  You have a fever.  You have redness around your IV site that gets worse. Get help right away if:  You have difficulty breathing.  You have chest pain.  You have blood in your urine or stool, or you vomit blood. Summary  After the procedure, it is common to have a sore throat or nausea. It is also common to feel tired.  Have a responsible adult stay with you for the first 24 hours after general anesthesia. It is important to have someone help care for you until you are awake and alert.  When you feel hungry, start by eating small amounts of foods that are soft and easy to digest (bland), such as toast. Gradually return to your regular diet.  Drink enough fluid to keep your urine pale yellow.  Return to your normal activities as told by your health care provider. Ask your health care provider what activities are safe for you. This information is not   intended to replace advice given to you by your health care provider. Make sure you discuss any questions you have with your health care provider. Document Revised: 06/29/2017 Document Reviewed: 02/09/2017 Elsevier Patient Education  2020 Elsevier Inc.  

## 2019-11-13 ENCOUNTER — Encounter
Admission: RE | Disposition: A | Discharge status: Home / Self Care | Payer: Self-pay | Source: Home / Self Care | Attending: Surgery

## 2019-11-13 ENCOUNTER — Ambulatory Visit: Payer: Medicare Other | Admitting: Anesthesiology

## 2019-11-13 ENCOUNTER — Encounter: Payer: Self-pay | Admitting: Surgery

## 2019-11-13 ENCOUNTER — Ambulatory Visit
Admission: RE | Admit: 2019-11-13 | Discharge: 2019-11-13 | Disposition: A | Discharge status: Home / Self Care | Payer: Medicare Other | Attending: Surgery | Admitting: Surgery

## 2019-11-13 ENCOUNTER — Other Ambulatory Visit: Payer: Self-pay

## 2019-11-13 DIAGNOSIS — F17200 Nicotine dependence, unspecified, uncomplicated: Secondary | ICD-10-CM | POA: Diagnosis not present

## 2019-11-13 DIAGNOSIS — R519 Headache, unspecified: Secondary | ICD-10-CM | POA: Insufficient documentation

## 2019-11-13 DIAGNOSIS — Z79899 Other long term (current) drug therapy: Secondary | ICD-10-CM | POA: Diagnosis not present

## 2019-11-13 DIAGNOSIS — J449 Chronic obstructive pulmonary disease, unspecified: Secondary | ICD-10-CM | POA: Diagnosis not present

## 2019-11-13 DIAGNOSIS — Z8546 Personal history of malignant neoplasm of prostate: Secondary | ICD-10-CM | POA: Insufficient documentation

## 2019-11-13 DIAGNOSIS — Z86010 Personal history of colonic polyps: Secondary | ICD-10-CM | POA: Insufficient documentation

## 2019-11-13 DIAGNOSIS — K4090 Unilateral inguinal hernia, without obstruction or gangrene, not specified as recurrent: Secondary | ICD-10-CM | POA: Diagnosis not present

## 2019-11-13 DIAGNOSIS — Z820 Family history of epilepsy and other diseases of the nervous system: Secondary | ICD-10-CM | POA: Insufficient documentation

## 2019-11-13 DIAGNOSIS — K219 Gastro-esophageal reflux disease without esophagitis: Secondary | ICD-10-CM | POA: Insufficient documentation

## 2019-11-13 DIAGNOSIS — I10 Essential (primary) hypertension: Secondary | ICD-10-CM | POA: Insufficient documentation

## 2019-11-13 HISTORY — PX: INGUINAL HERNIA REPAIR: SHX194

## 2019-11-13 SURGERY — REPAIR, HERNIA, INGUINAL, ADULT
Anesthesia: General | Site: Abdomen | Laterality: Right

## 2019-11-13 MED ORDER — FENTANYL CITRATE (PF) 100 MCG/2ML IJ SOLN
INTRAMUSCULAR | Status: DC | PRN
  Administered 2019-11-13: 50 ug via INTRAVENOUS
  Administered 2019-11-13: 25 ug via INTRAVENOUS

## 2019-11-13 MED ORDER — ORAL CARE MOUTH RINSE: 15.0000 mL | Freq: Once | OROMUCOSAL | Status: DC

## 2019-11-13 MED ORDER — LACTATED RINGERS IV SOLN: 10.0000 mL/h | INTRAVENOUS | Status: DC

## 2019-11-13 MED ORDER — BUPIVACAINE LIPOSOME 1.3 % IJ SUSP: 20.0000 mL | Freq: Once | INTRAMUSCULAR | Status: DC

## 2019-11-13 MED ORDER — OXYCODONE HCL 5 MG PO TABS
5.0000 mg | ORAL_TABLET | Freq: Once | ORAL | Status: AC | PRN
  Administered 2019-11-13: 09:00:00 5 mg via ORAL

## 2019-11-13 MED ORDER — ONDANSETRON HCL 4 MG/2ML IJ SOLN
INTRAMUSCULAR | Status: DC | PRN
  Administered 2019-11-13: 4 mg via INTRAVENOUS

## 2019-11-13 MED ORDER — MIDAZOLAM HCL 5 MG/5ML IJ SOLN
INTRAMUSCULAR | Status: DC | PRN
  Administered 2019-11-13: 2 mg via INTRAVENOUS

## 2019-11-13 MED ORDER — GABAPENTIN 300 MG PO CAPS
300.0000 mg | ORAL_CAPSULE | ORAL | Status: AC
  Administered 2019-11-13: 300 mg via ORAL

## 2019-11-13 MED ORDER — GLYCOPYRROLATE 0.2 MG/ML IJ SOLN
INTRAMUSCULAR | Status: DC | PRN
  Administered 2019-11-13: .1 mg via INTRAVENOUS

## 2019-11-13 MED ORDER — CHLORHEXIDINE GLUCONATE CLOTH 2 % EX PADS
6.0000 | MEDICATED_PAD | Freq: Once | CUTANEOUS | Status: AC
  Administered 2019-11-13: 6 via TOPICAL

## 2019-11-13 MED ORDER — CELECOXIB 200 MG PO CAPS
200.0000 mg | ORAL_CAPSULE | ORAL | Status: AC
  Administered 2019-11-13: 200 mg via ORAL

## 2019-11-13 MED ORDER — EPHEDRINE SULFATE 50 MG/ML IJ SOLN
INTRAMUSCULAR | Status: DC | PRN
Start: 2019-11-13 — End: 2019-11-13
  Administered 2019-11-13: 2.5 mg via INTRAVENOUS
  Administered 2019-11-13: 10 mg via INTRAVENOUS

## 2019-11-13 MED ORDER — ACETAMINOPHEN 500 MG PO TABS
1000.0000 mg | ORAL_TABLET | ORAL | Status: AC
  Administered 2019-11-13: 07:00:00 1000 mg via ORAL

## 2019-11-13 MED ORDER — DEXAMETHASONE SODIUM PHOSPHATE 4 MG/ML IJ SOLN
INTRAMUSCULAR | Status: DC | PRN
  Administered 2019-11-13: 4 mg via INTRAVENOUS

## 2019-11-13 MED ORDER — HYDROCODONE-ACETAMINOPHEN 5-325 MG PO TABS
1.0000 | ORAL_TABLET | Freq: Four times a day (QID) | ORAL | 0 refills | Status: DC | PRN
Start: 2019-11-13 — End: 2019-11-27

## 2019-11-13 MED ORDER — OXYCODONE HCL 5 MG/5ML PO SOLN: 5.0000 mg | Freq: Once | ORAL | Status: AC | PRN

## 2019-11-13 MED ORDER — CHLORHEXIDINE GLUCONATE CLOTH 2 % EX PADS: 6.0000 | MEDICATED_PAD | Freq: Once | CUTANEOUS | Status: DC

## 2019-11-13 MED ORDER — CEFAZOLIN SODIUM-DEXTROSE 2-4 GM/100ML-% IV SOLN
2.0000 g | INTRAVENOUS | Status: AC
  Administered 2019-11-13: 07:00:00 2 g via INTRAVENOUS

## 2019-11-13 MED ORDER — LIDOCAINE HCL (CARDIAC) PF 100 MG/5ML IV SOSY
PREFILLED_SYRINGE | INTRAVENOUS | Status: DC | PRN
  Administered 2019-11-13: 40 mg via INTRATRACHEAL

## 2019-11-13 MED ORDER — BUPIVACAINE LIPOSOME 1.3 % IJ SUSP
INTRAMUSCULAR | Status: DC | PRN
  Administered 2019-11-13: 13 mL
  Administered 2019-11-13: 7 mL

## 2019-11-13 MED ORDER — LABETALOL HCL 5 MG/ML IV SOLN
INTRAVENOUS | Status: DC | PRN
  Administered 2019-11-13: 5 mg via INTRAVENOUS

## 2019-11-13 MED ORDER — PROPOFOL 10 MG/ML IV BOLUS
INTRAVENOUS | Status: DC | PRN
  Administered 2019-11-13: 150 mg via INTRAVENOUS

## 2019-11-13 MED ORDER — CHLORHEXIDINE GLUCONATE 0.12 % MT SOLN: 15.0000 mL | Freq: Once | OROMUCOSAL | Status: DC

## 2019-11-13 MED ORDER — IBUPROFEN 800 MG PO TABS
800.0000 mg | ORAL_TABLET | Freq: Three times a day (TID) | ORAL | 0 refills | Status: AC | PRN
Start: 2019-11-13 — End: ?

## 2019-11-13 SURGICAL SUPPLY — 31 items
BLADE BOVIE TIP EXT 4 (BLADE) ×2 IMPLANT
BLADE CLIPPER SPEC (BLADE) ×2 IMPLANT
CANISTER SUCT 1200ML W/VALVE (MISCELLANEOUS) ×2 IMPLANT
CHLORAPREP W/TINT 26 (MISCELLANEOUS) ×2 IMPLANT
DERMABOND ADVANCED (GAUZE/BANDAGES/DRESSINGS) ×1
DERMABOND ADVANCED .7 DNX12 (GAUZE/BANDAGES/DRESSINGS) ×1 IMPLANT
DRAPE LAPAROTOMY T 102X78X121 (DRAPES) ×2 IMPLANT
ELECT REM PT RETURN 9FT ADLT (ELECTROSURGICAL) ×2
ELECTRODE REM PT RTRN 9FT ADLT (ELECTROSURGICAL) ×1 IMPLANT
GLOVE BIOGEL PI IND STRL 7.0 (GLOVE) ×1 IMPLANT
GLOVE BIOGEL PI INDICATOR 7.0 (GLOVE) ×1
GLOVE ORTHO TXT STRL SZ7.5 (GLOVE) ×2 IMPLANT
GOWN STRL REUS W/ TWL LRG LVL3 (GOWN DISPOSABLE) ×2 IMPLANT
GOWN STRL REUS W/TWL LRG LVL3 (GOWN DISPOSABLE) ×2
MESH HERNIA SYS ULTRAPRO LRG (Mesh General) ×2 IMPLANT
NEEDLE HYPO 21X1.5 SAFETY (NEEDLE) ×2 IMPLANT
NEEDLE HYPO 25GX1X1/2 BEV (NEEDLE) ×2 IMPLANT
NS IRRIG 500ML POUR BTL (IV SOLUTION) ×2 IMPLANT
PACK BASIN MINOR ARMC (MISCELLANEOUS) ×2 IMPLANT
PENCIL SMOKE EVACUATOR (MISCELLANEOUS) ×2 IMPLANT
SPONGE LAP 18X18 RF (DISPOSABLE) ×2 IMPLANT
STAPLER SKIN PROX 35W (STAPLE) ×2 IMPLANT
SUT MNCRL 4-0 (SUTURE) ×1
SUT MNCRL 4-0 27XMFL (SUTURE) ×1
SUT VIC AB 2-0 CT1 27 (SUTURE) ×1
SUT VIC AB 2-0 CT1 TAPERPNT 27 (SUTURE) ×1 IMPLANT
SUT VIC AB 3-0 SH 27 (SUTURE) ×1
SUT VIC AB 3-0 SH 27X BRD (SUTURE) ×1 IMPLANT
SUTURE MNCRL 4-0 27XMF (SUTURE) ×1 IMPLANT
SYR 10ML LL (SYRINGE) ×2 IMPLANT
SYR BULB IRRIG 60ML STRL (SYRINGE) ×2 IMPLANT

## 2019-11-13 NOTE — Interval H&P Note (Signed)
History and Physical Interval Note:  11/13/2019 7:16 AM  Dalton Boone  has presented today for surgery, with the diagnosis of Right inguinal hernia.  The various methods of treatment have been discussed with the patient and family. After consideration of risks, benefits and other options for treatment, the patient has consented to  Procedure(s): HERNIA REPAIR INGUINAL ADULT, OPEN (Right) as a surgical intervention.  The patient's history has been reviewed, patient examined, no change in status, stable for surgery.  I have reviewed the patient's chart and labs.  Questions were answered to the patient's satisfaction.    The right side is marked as the correct laterality.   Dalton Boone

## 2019-11-13 NOTE — Anesthesia Preprocedure Evaluation (Signed)
Anesthesia Evaluation  Patient identified by MRN, date of birth, ID band Patient awake    Reviewed: NPO status   History of Anesthesia Complications Negative for: history of anesthetic complications  Airway Mallampati: II  TM Distance: >3 FB Neck ROM: full    Dental no notable dental hx.    Pulmonary COPD (mild),  COPD inhaler, Current SmokerPatient did not abstain from smoking.,    Pulmonary exam normal        Cardiovascular Exercise Tolerance: Good hypertension, Normal cardiovascular exam     Neuro/Psych  Headaches, negative psych ROS   GI/Hepatic Neg liver ROS, GERD  Controlled,  Endo/Other  negative endocrine ROS  Renal/GU negative Renal ROS  negative genitourinary   Musculoskeletal   Abdominal   Peds  Hematology Prost cancer : 2013   Anesthesia Other Findings Covid: NEG  Reproductive/Obstetrics                             Anesthesia Physical Anesthesia Plan  ASA: II  Anesthesia Plan: General   Post-op Pain Management:    Induction:   PONV Risk Score and Plan: 2 and Midazolam and Ondansetron  Airway Management Planned:   Additional Equipment:   Intra-op Plan:   Post-operative Plan:   Informed Consent: I have reviewed the patients History and Physical, chart, labs and discussed the procedure including the risks, benefits and alternatives for the proposed anesthesia with the patient or authorized representative who has indicated his/her understanding and acceptance.       Plan Discussed with: CRNA  Anesthesia Plan Comments:         Anesthesia Quick Evaluation

## 2019-11-13 NOTE — Op Note (Signed)
  Procedure Date:  11/13/2019  Pre-operative Diagnosis:  Right inguinal hernia.   Post-operative Diagnosis: Right direct inguinal hernia.   Procedure:  Right Inguinal Hernia Repair with UHS  Surgeon:  Ronny Bacon,  M.D., [facility]  Anesthesia:  General endotracheal  Estimated Blood Loss:  10 ml  Specimens:  none  Complications:  none  Indications for Procedure:  This is a 64 y.o. male who presents with a right inguinal hernia.  The options of surgery versus observation were reviewed with the patient and/or family. The risks of anesthesia, bleeding, infection, recurrence of symptoms/hernia, potential for an additional unplanned procedure, injury to surrounding structures, and chronic pain were all discussed with the patient and was willing to proceed.  Description of Procedure: The patient was correctly identified in the preoperative area and brought into the operating room.  The patient was placed supine with VTE prophylaxis in place.  Appropriate time-outs were performed.  Anesthesia was induced and the patient was intubated.  Appropriate antibiotics were infused.  The right groin and lower abdomen were prepped and draped in a sterile fashion.   Local infiltration with Exparel to skin, sub-q, and fascial tissues.  An oblique incision was made between the pubic symphysis extending laterally toward the ASIS. Using electrocautery, the subcutaneous tissues were dissected, assuring adequate hemostasis, until reaching the external oblique aponeurosis.  A 1 cm incision was made over the aponeurosis and extended laterally and medially toward the external inguinal ring avoiding injury to the ilioinguinal nerve.  The cord was encircled using a Penrose drain and using medial and lateral retraction, the cord structures were identified and the direct hernia sac was identified and dissected free.  The cord was opened and no indirect sac is noted.  The attenuated transversalis fascia was entered and freed of  any pre-peritoneal contents carefully. I then dissected the preperitoneal space with digital blunt dissection.  A sponge was placed to ensure a full circle of freedom for the internal circle mesh placement.   An Ultrapro Hernia System mesh was then placed ensuring full coverage of the preperitoneal space and cut to shape/size and sutured to the pubic tubercle, shelving edge, and conjoined tendon with interrupted 2-0 Vicryl sutures.  The a slit in the onlay component of the mesh were sutured behind the cord and sutured together creating a reinforced internal ring.  The external oblique was then closed in running fashion with 2-0 Vicryl, creating a new external ring, continuing to ensure no compromise to the ileo-inguinal nerves during approximation.  The wound was irrigated and the incision was closed in two layers with interrupted 3-0 Vicryl and running 4-0 Monocryl.  The wound was cleaned and sealed with DermaBond.  The patient was emerged from anesthesia and extubated and brought to the recovery room for further management.  The patient tolerated the procedure well and all counts were correct at the end of the case.   Ronny Bacon, M.D., [facility]  11/13/2019 ; 8:55 AM

## 2019-11-13 NOTE — Anesthesia Procedure Notes (Signed)
Procedure Name: LMA Insertion Date/Time: 11/13/2019 7:30 AM Performed by: Mayme Genta, CRNA Pre-anesthesia Checklist: Patient identified, Emergency Drugs available, Suction available, Timeout performed and Patient being monitored Patient Re-evaluated:Patient Re-evaluated prior to induction Oxygen Delivery Method: Circle system utilized Preoxygenation: Pre-oxygenation with 100% oxygen Induction Type: IV induction LMA: LMA inserted LMA Size: 4.0 Number of attempts: 1 Placement Confirmation: positive ETCO2 and breath sounds checked- equal and bilateral Tube secured with: Tape

## 2019-11-13 NOTE — Anesthesia Postprocedure Evaluation (Signed)
Anesthesia Post Note  Patient: Dalton Boone  Procedure(s) Performed: HERNIA REPAIR INGUINAL ADULT, OPEN (Right Abdomen)     Patient location during evaluation: PACU Anesthesia Type: General Level of consciousness: awake and alert Pain management: pain level controlled Vital Signs Assessment: post-procedure vital signs reviewed and stable Respiratory status: spontaneous breathing, nonlabored ventilation, respiratory function stable and patient connected to nasal cannula oxygen Cardiovascular status: blood pressure returned to baseline and stable Postop Assessment: no apparent nausea or vomiting Anesthetic complications: no    Deshae Dickison

## 2019-11-13 NOTE — Transfer of Care (Signed)
Immediate Anesthesia Transfer of Care Note  Patient: Dalton Boone  Procedure(s) Performed: HERNIA REPAIR INGUINAL ADULT, OPEN (Right Abdomen)  Patient Location: PACU  Anesthesia Type: General  Level of Consciousness: awake, alert  and patient cooperative  Airway and Oxygen Therapy: Patient Spontanous Breathing and Patient connected to supplemental oxygen  Post-op Assessment: Post-op Vital signs reviewed, Patient's Cardiovascular Status Stable, Respiratory Function Stable, Patent Airway and No signs of Nausea or vomiting  Post-op Vital Signs: Reviewed and stable  Complications: No apparent anesthesia complications

## 2019-11-14 ENCOUNTER — Encounter: Payer: Self-pay | Admitting: *Deleted

## 2019-11-27 ENCOUNTER — Ambulatory Visit (INDEPENDENT_AMBULATORY_CARE_PROVIDER_SITE_OTHER): Payer: Self-pay | Admitting: Surgery

## 2019-11-27 ENCOUNTER — Other Ambulatory Visit: Payer: Self-pay

## 2019-11-27 ENCOUNTER — Encounter: Payer: Self-pay | Admitting: Surgery

## 2019-11-27 VITALS — BP 145/76 | HR 78 | Temp 97.7°F | Resp 12 | Wt 195.4 lb

## 2019-11-27 DIAGNOSIS — Z8719 Personal history of other diseases of the digestive system: Secondary | ICD-10-CM | POA: Insufficient documentation

## 2019-11-27 DIAGNOSIS — Z9889 Other specified postprocedural states: Secondary | ICD-10-CM

## 2019-11-27 NOTE — Progress Notes (Signed)
[facility] POST-OP OFFICE VISIT  11/27/2019  HPI: Dalton Boone is a 64 y.o. male 14 days s/p open right inguinal hernia repair with Prolene hernia system.  Denies fever, chills, nausea, vomiting.  Reports good appetite.  Denies any bowel movement issues.  Notes the swelling in the area of the right groin.  Denies any redness or significant ecchymosis.  Wants to get on his Markus Daft and ride.  Vital signs: BP (!) 145/76   Pulse 78   Temp 97.7 F (36.5 C)   Resp 12   Wt 195 lb 6.4 oz (88.6 kg)   SpO2 96%   BMI 28.04 kg/m    Physical Exam: Constitutional: Appears well. Abdomen: Anticipated raised thickening at and it directly beneath incision.  No ecchymosis, no induration, no erythema.  Essentially nontender. Skin: Incision clean dry and intact without drainage or erythema.  Assessment/Plan: This is a 64 y.o. male 14 days s/p right inguinal hernia repair with PHS.  Patient Active Problem List   Diagnosis Date Noted  . Right inguinal hernia 10/30/2019  . Chronic GERD   . History of adenomatous polyp of colon   . H/O hemorrhoidectomy 01/29/2018  . Rectal bleeding 01/29/2018  . Colon polyp 01/29/2018  . Family history of colon cancer in mother 01/29/2018  . Personal history of tobacco use, presenting hazards to health 11/21/2016    - Strongly advised against any form of lifting greater than 15 pounds over the next 4 to 6 weeks.  This involves pushing/pulling movements as well.  After 4 weeks when may gradually engage in more activities remaining aware of any new pain/tenderness elicited, and avoiding those for the full duration of 6 weeks.  Walking is encouraged.  Climbing stairs with caution. May resume riding with the above cautions. Follow-up for recheck in 1 month.  Ronny Bacon M.D., FACS 11/27/2019, 9:36 AM

## 2019-11-27 NOTE — Patient Instructions (Addendum)
We will see you in one month for a follow up. See your appointment below.   Call the office if you have any questions or concerns.   GENERAL POST-OPERATIVE PATIENT INSTRUCTIONS   WOUND CARE INSTRUCTIONS:  Keep a dry clean dressing on the wound if there is drainage. The initial bandage may be removed after 24 hours.  Once the wound has quit draining you may leave it open to air.  If clothing rubs against the wound or causes irritation and the wound is not draining you may cover it with a dry dressing during the daytime.  Try to keep the wound dry and avoid ointments on the wound unless directed to do so.  If the wound becomes bright red and painful or starts to drain infected material that is not clear, please contact your physician immediately.  If the wound is mildly pink and has a thick firm ridge underneath it, this is normal, and is referred to as a healing ridge.  This will resolve over the next 4-6 weeks.  BATHING: You may shower if you have been informed of this by your surgeon. However, Please do not submerge in a tub, hot tub, or pool until incisions are completely sealed or have been told by your surgeon that you may do so.  DIET:  You may eat any foods that you can tolerate.  It is a good idea to eat a high fiber diet and take in plenty of fluids to prevent constipation.  If you do become constipated you may want to take a mild laxative or take ducolax tablets on a daily basis until your bowel habits are regular.  Constipation can be very uncomfortable, along with straining, after recent surgery.  ACTIVITY:  You are encouraged to cough and deep breath or use your incentive spirometer if you were given one, every 15-30 minutes when awake.  This will help prevent respiratory complications and low grade fevers post-operatively if you had a general anesthetic.  You may want to hug a pillow when coughing and sneezing to add additional support to the surgical area, if you had abdominal or chest  surgery, which will decrease pain during these times.  You are encouraged to walk and engage in light activity for the next two weeks.  You should not lift more than 20 pounds, until 12/11/19 as it could put you at increased risk for complications.  Twenty pounds is roughly equivalent to a plastic bag of groceries. At that time- Listen to your body when lifting, if you have pain when lifting, stop and then try again in a few days. Soreness after doing exercises or activities of daily living is normal as you get back in to your normal routine.  MEDICATIONS:  Try to take narcotic medications and anti-inflammatory medications, such as tylenol, ibuprofen, naprosyn, etc., with food.  This will minimize stomach upset from the medication.  Should you develop nausea and vomiting from the pain medication, or develop a rash, please discontinue the medication and contact your physician.  You should not drive, make important decisions, or operate machinery when taking narcotic pain medication.  SUNBLOCK Use sun block to incision area over the next year if this area will be exposed to sun. This helps decrease scarring and will allow you avoid a permanent darkened area over your incision.  QUESTIONS:  Please feel free to call our office if you have any questions, and we will be glad to assist you.

## 2019-12-22 ENCOUNTER — Telehealth: Payer: Self-pay

## 2019-12-22 DIAGNOSIS — Z122 Encounter for screening for malignant neoplasm of respiratory organs: Secondary | ICD-10-CM

## 2019-12-22 DIAGNOSIS — Z87891 Personal history of nicotine dependence: Secondary | ICD-10-CM

## 2019-12-22 NOTE — Telephone Encounter (Signed)
Patient has been notified that the low dose lung cancer screening CT scan is due currently or will be in near future.  Confirmed that patient is within the appropriate age range and asymptomatic, (no signs or symptoms of lung cancer).  Patient denies illness that would prevent curative treatment for lung cancer if found.  Patient is agreeable for CT scan being scheduled.   Verified smoking history (current smoker, with 50 year 1.75 pps history).    CT scan scheduled for 01/01/20 @ 8:45  Now has Medicare (in system) and a Medicare supplement [employer] (card scanned in [employer] tab of chart)

## 2019-12-25 NOTE — Addendum Note (Signed)
Addended by: Lieutenant Diego on: 12/25/2019 12:24 PM   Modules accepted: Orders

## 2019-12-25 NOTE — Telephone Encounter (Signed)
Smoking history: current, 82.25 pack year

## 2020-01-01 ENCOUNTER — Ambulatory Visit
Admission: RE | Admit: 2020-01-01 | Discharge: 2020-01-01 | Disposition: A | Discharge status: Home / Self Care | Payer: Medicare Other | Source: Ambulatory Visit | Attending: Oncology | Admitting: Oncology

## 2020-01-01 ENCOUNTER — Other Ambulatory Visit: Payer: Self-pay

## 2020-01-01 ENCOUNTER — Encounter: Payer: Self-pay | Admitting: Surgery

## 2020-01-01 ENCOUNTER — Ambulatory Visit (INDEPENDENT_AMBULATORY_CARE_PROVIDER_SITE_OTHER): Payer: Self-pay | Admitting: Surgery

## 2020-01-01 VITALS — BP 160/100 | HR 73 | Temp 97.7°F | Resp 14 | Ht 70.5 in | Wt 194.0 lb

## 2020-01-01 DIAGNOSIS — Z9889 Other specified postprocedural states: Secondary | ICD-10-CM

## 2020-01-01 DIAGNOSIS — Z8719 Personal history of other diseases of the digestive system: Secondary | ICD-10-CM

## 2020-01-01 DIAGNOSIS — K4090 Unilateral inguinal hernia, without obstruction or gangrene, not specified as recurrent: Secondary | ICD-10-CM

## 2020-01-01 DIAGNOSIS — Z87891 Personal history of nicotine dependence: Secondary | ICD-10-CM | POA: Insufficient documentation

## 2020-01-01 DIAGNOSIS — Z122 Encounter for screening for malignant neoplasm of respiratory organs: Secondary | ICD-10-CM | POA: Diagnosis present

## 2020-01-01 NOTE — Patient Instructions (Addendum)
Please call our office if you have questions or concerns.    GENERAL POST-OPERATIVE PATIENT INSTRUCTIONS   WOUND CARE INSTRUCTIONS:  Keep a dry clean dressing on the wound if there is drainage. The initial bandage may be removed after 24 hours.  Once the wound has quit draining you may leave it open to air.  If clothing rubs against the wound or causes irritation and the wound is not draining you may cover it with a dry dressing during the daytime.  Try to keep the wound dry and avoid ointments on the wound unless directed to do so.  If the wound becomes bright red and painful or starts to drain infected material that is not clear, please contact your physician immediately.  If the wound is mildly pink and has a thick firm ridge underneath it, this is normal, and is referred to as a healing ridge.  This will resolve over the next 4-6 weeks.  BATHING: You may shower if you have been informed of this by your surgeon. However, Please do not submerge in a tub, hot tub, or pool until incisions are completely sealed or have been told by your surgeon that you may do so.  DIET:  You may eat any foods that you can tolerate.  It is a good idea to eat a high fiber diet and take in plenty of fluids to prevent constipation.  If you do become constipated you may want to take a mild laxative or take ducolax tablets on a daily basis until your bowel habits are regular.  Constipation can be very uncomfortable, along with straining, after recent surgery.  ACTIVITY:  You are encouraged to cough and deep breath or use your incentive spirometer if you were given one, every 15-30 minutes when awake.  This will help prevent respiratory complications and low grade fevers post-operatively if you had a general anesthetic.  You may want to hug a pillow when coughing and sneezing to add additional support to the surgical area, if you had abdominal or chest surgery, which will decrease pain during these times.  You are encouraged  to walk and engage in light activity for the next two weeks.  You should not lift more than 20 pounds, until 6/16/21as it could put you at increased risk for complications.  Twenty pounds is roughly equivalent to a plastic bag of groceries. At that time- Listen to your body when lifting, if you have pain when lifting, stop and then try again in a few days. Soreness after doing exercises or activities of daily living is normal as you get back in to your normal routine.  MEDICATIONS:  Try to take narcotic medications and anti-inflammatory medications, such as tylenol, ibuprofen, naprosyn, etc., with food.  This will minimize stomach upset from the medication.  Should you develop nausea and vomiting from the pain medication, or develop a rash, please discontinue the medication and contact your physician.  You should not drive, make important decisions, or operate machinery when taking narcotic pain medication.  SUNBLOCK Use sun block to incision area over the next year if this area will be exposed to sun. This helps decrease scarring and will allow you avoid a permanent darkened area over your incision.  QUESTIONS:  Please feel free to call our office if you have any questions, and we will be glad to assist you. [phone]

## 2020-01-01 NOTE — Progress Notes (Signed)
[facility] POST-OP OFFICE VISIT  01/01/2020  HPI: Dalton Boone is a 64 y.o. male 6 weeks s/p open right inguinal hernia repair.  Dalton Boone has no complaints today.  Vital signs: BP (!) 160/100   Pulse 73   Temp 97.7 F (36.5 C) (Oral)   Resp 14   Ht 5' 10.5" (1.791 m)   Wt 194 lb (88 kg)   SpO2 96%   BMI 27.44 kg/m    Physical Exam: Constitutional: Appears well Abdomen: Benign Skin: Expected thickening of right groin scar, nontender, no evidence of hernia or infection.  Assessment/Plan: This is a 64 y.o. male 6 weeks s/p open right inguinal hernia repair.  Prior pain/discomfort has apparently resolved.  Patient Active Problem List   Diagnosis Date Noted  . Status post inguinal hernia repair using synthetic patch 11/27/2019  . Chronic GERD   . History of adenomatous polyp of colon   . H/O hemorrhoidectomy 01/29/2018  . Rectal bleeding 01/29/2018  . Colon polyp 01/29/2018  . Family history of colon cancer in mother 01/29/2018  . Personal history of tobacco use, presenting hazards to health 11/21/2016    -Follow-up as needed.   Ronny Bacon M.D., FACS 01/01/2020, 1:22 PM

## 2020-01-07 ENCOUNTER — Encounter: Payer: Self-pay | Admitting: *Deleted

## 2020-10-24 IMAGING — CT CT CHEST LUNG CANCER SCREENING LOW DOSE
2 of 5 series · 15 of 40 positions shown, 18 images · non-contrast
Comparison: Low-dose lung cancer screening CT chest dated
11/27/2017

CLINICAL DATA: 63-year-old male current smoker, with 81 pack-year
history of smoking, for follow-up lung cancer screening

EXAM:
CT CHEST WITHOUT CONTRAST LOW-DOSE FOR LUNG CANCER SCREENING
TECHNIQUE: Multidetector CT imaging of the chest was performed following the
standard protocol without IV contrast.

[Series 3: lung · axial · 0.74mm/px · z∈[-1197,-911]mm · 12 of 318 slices shown, 15 images]
[im 16/318  mediastinal]
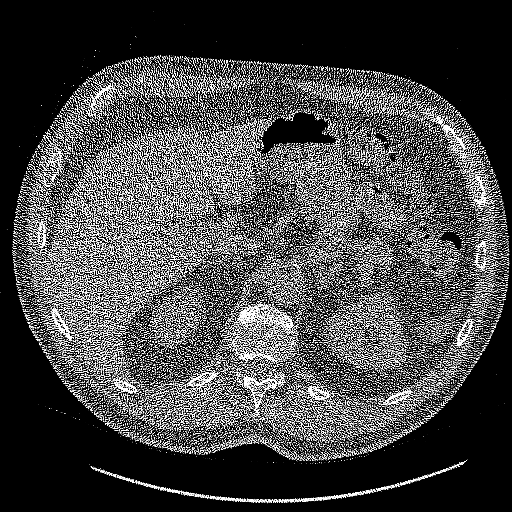
[im 16/318  lung]
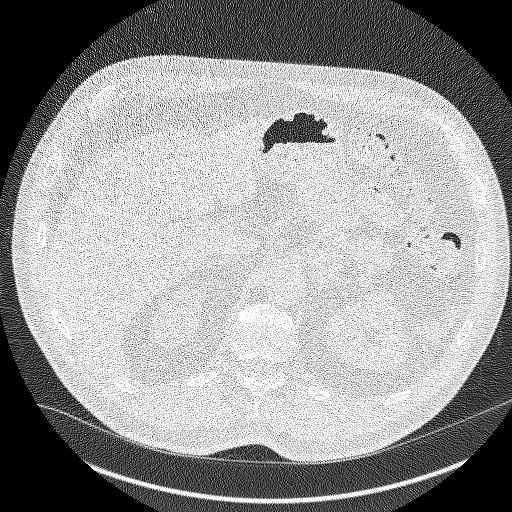
[im 46/318  lung]
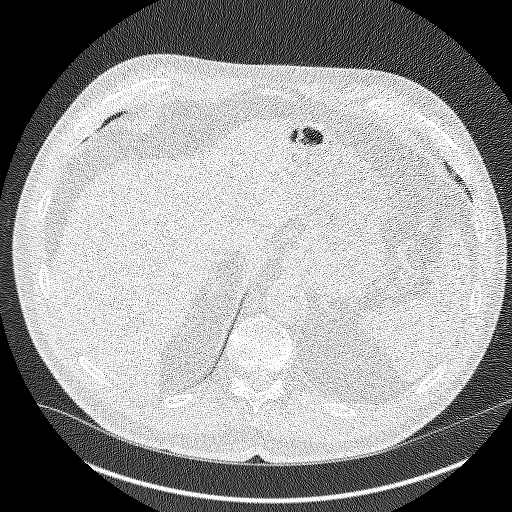
[im 76/318  lung]
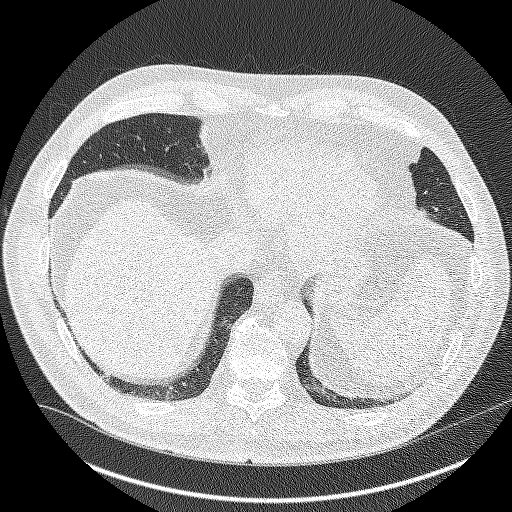
[im 91/318  lung]
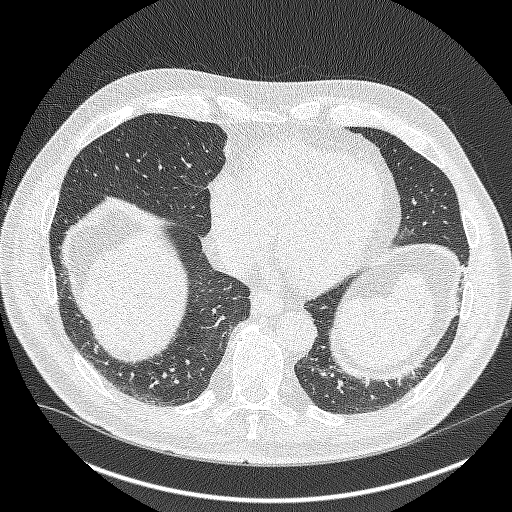
[im 121/318  mediastinal]
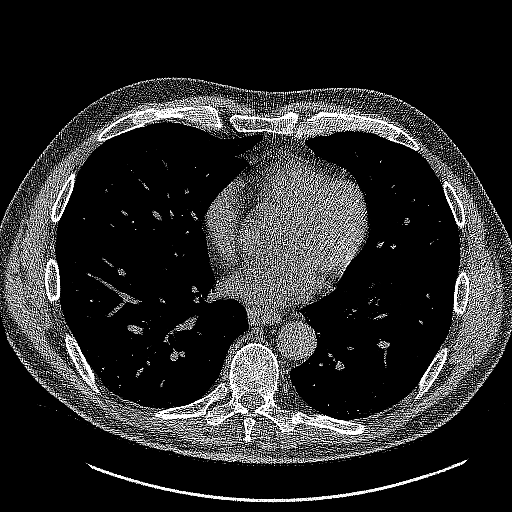
[im 121/318  lung]
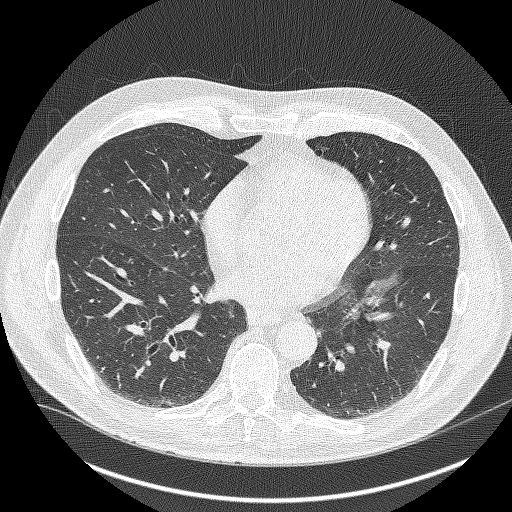
[im 151/318  lung]
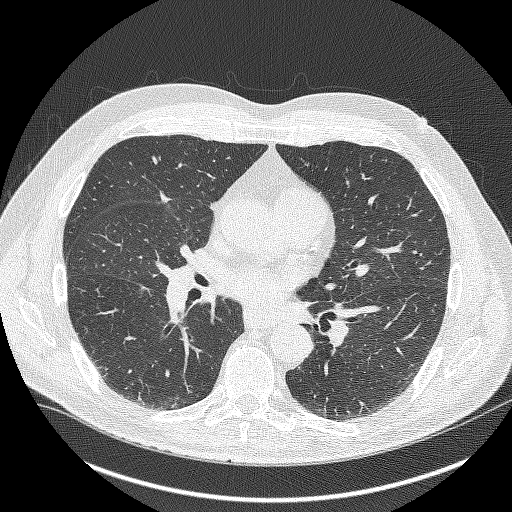
[im 167/318  lung]
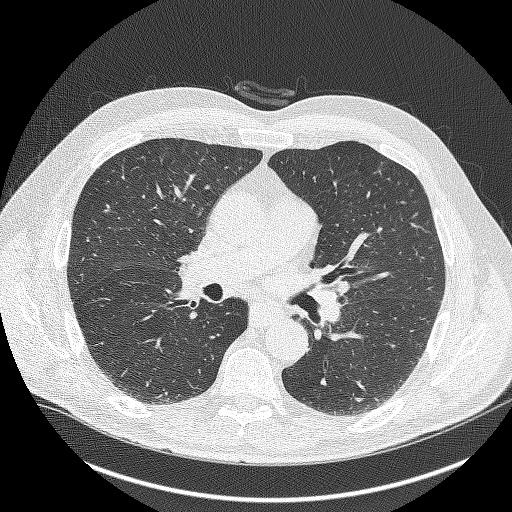
[im 197/318  lung]
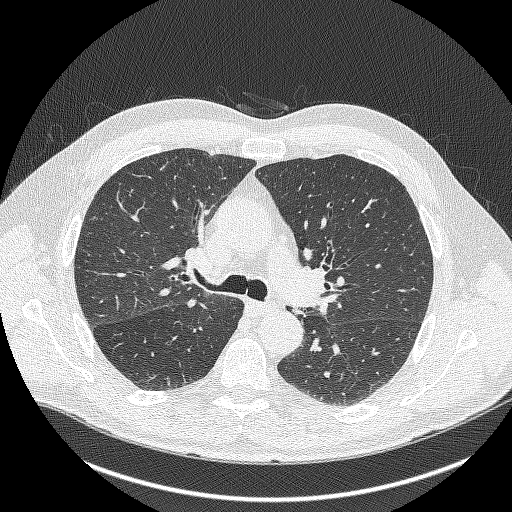
[im 227/318  mediastinal]
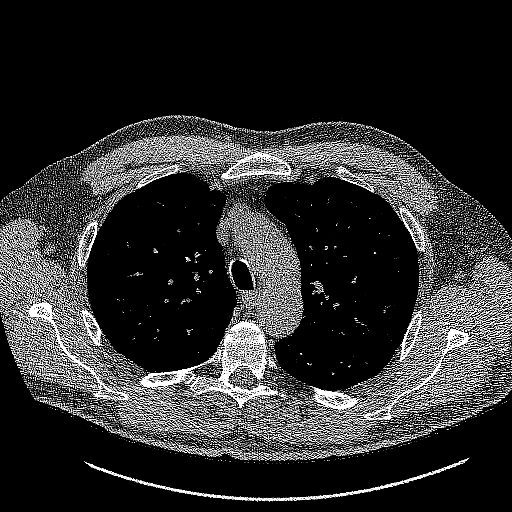
[im 227/318  lung]
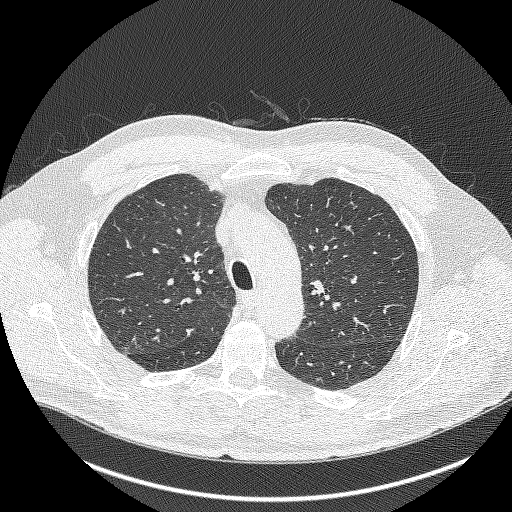
[im 242/318  lung]
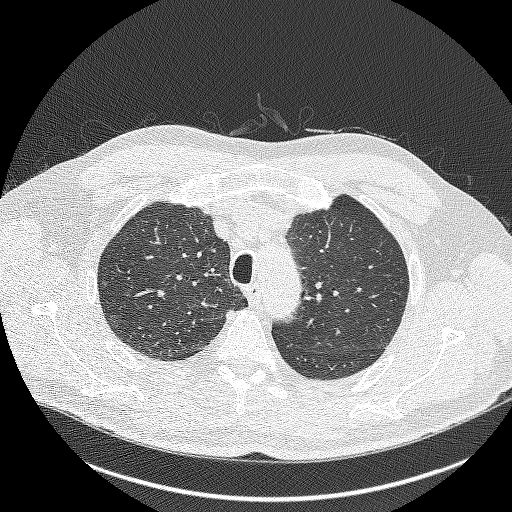
[im 272/318  lung]
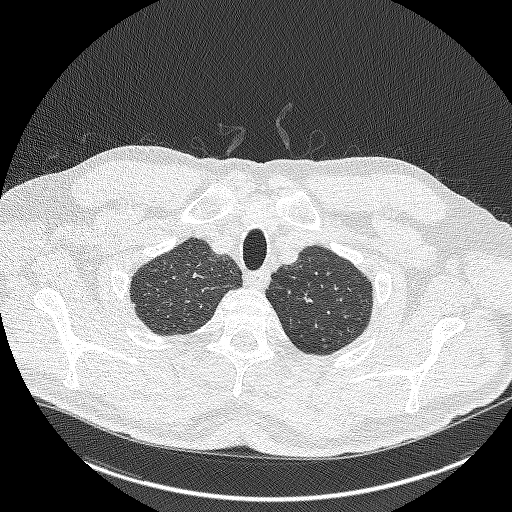
[im 302/318  lung]
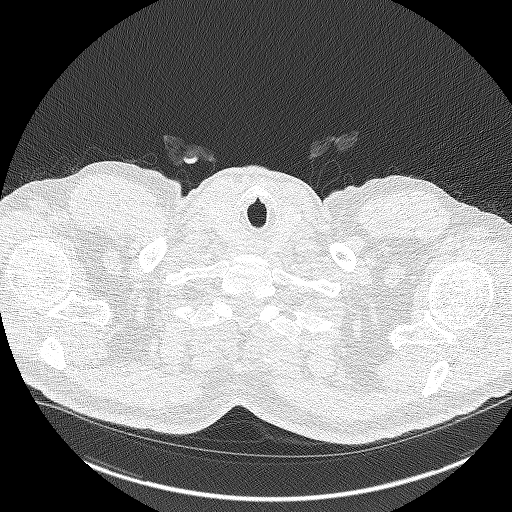

[Series 4: coronal lung · coronal · 0.62mm/px · 3 of 300 slices shown]
[im 60/300  lung]
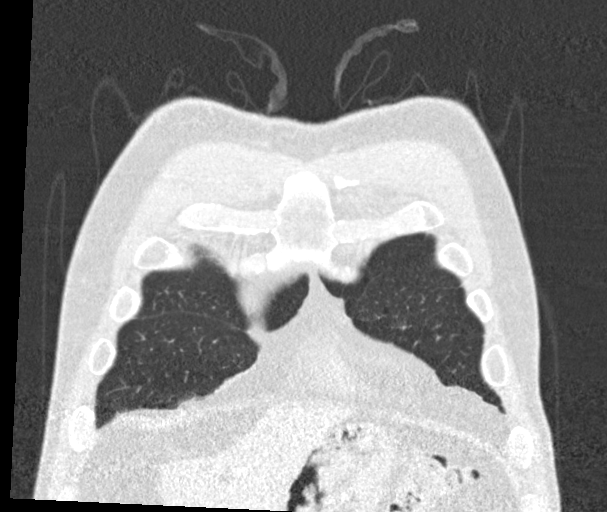
[im 120/300  lung]
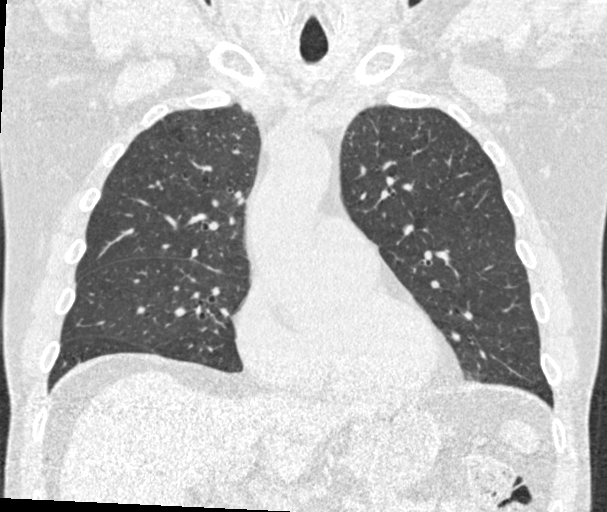
[im 180/300  lung]
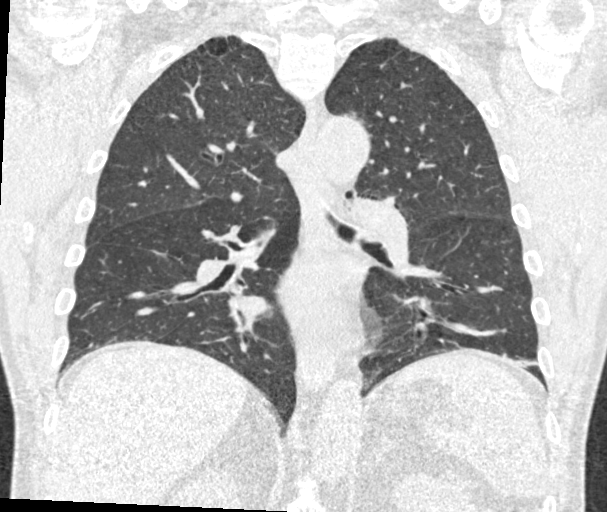

[15 of 40 positions shown; findings below may reference images not displayed]

FINDINGS: Cardiovascular: Heart is normal in size.  No pericardial effusion.

No evidence of thoracic aortic aneurysm. Very mild atherosclerotic
calcifications of the aortic arch.

Mild coronary sclerosis of the LAD and left circumflex.

Mediastinum/Nodes: No suspicious mediastinal lymphadenopathy.

Visualized thyroid is unremarkable.

Lungs/Pleura: Mild biapical pleural-parenchymal scarring.

Mild centrilobular and paraseptal emphysematous changes, upper lung
predominant.

Minimal dependent atelectasis in the bilateral lower lobes.

Scarring/atelectasis in the lateral left lower lobe.

No focal consolidation.

Small bilateral pulmonary nodules measuring up to 5 mm, unchanged.

No pleural effusion or pneumothorax.

Upper Abdomen: Visualized upper abdomen is grossly unremarkable.

Musculoskeletal: Degenerative changes of the visualized
thoracolumbar spine.
IMPRESSION: Lung-RADS 2, benign appearance or behavior. Continue annual
screening with low-dose chest CT without contrast in 12 months.

Aortic Atherosclerosis (YWTGR-H60.0) and Emphysema (YWTGR-P0Q.N).

## 2021-10-29 IMAGING — CT CT CHEST LUNG CANCER SCREENING LOW DOSE W/O CM
2 of 5 series · 15 of 40 positions shown, 18 images · non-contrast
Comparison: 12/27/18

CLINICAL DATA: Lung cancer screening. Eighty-two pack-year history.
Current asymptomatic smoker

EXAM:
CT CHEST WITHOUT CONTRAST LOW-DOSE FOR LUNG CANCER SCREENING
TECHNIQUE: Multidetector CT imaging of the chest was performed following the
standard protocol without IV contrast.

[Series 3: lung 1.00 · axial · 0.74mm/px · z∈[-1257,-949]mm · 12 of 342 slices shown, 15 images]
[im 17/342  mediastinal]
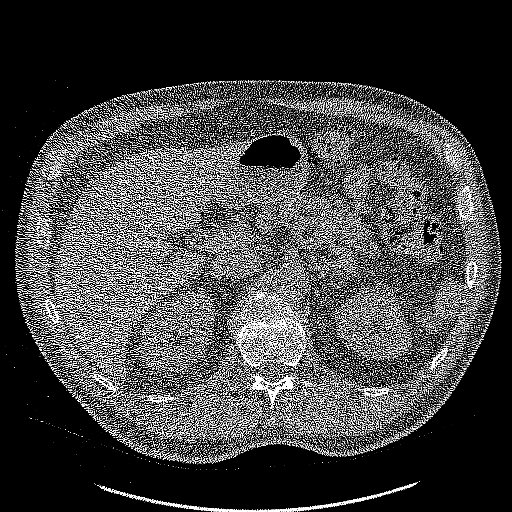
[im 17/342  lung]
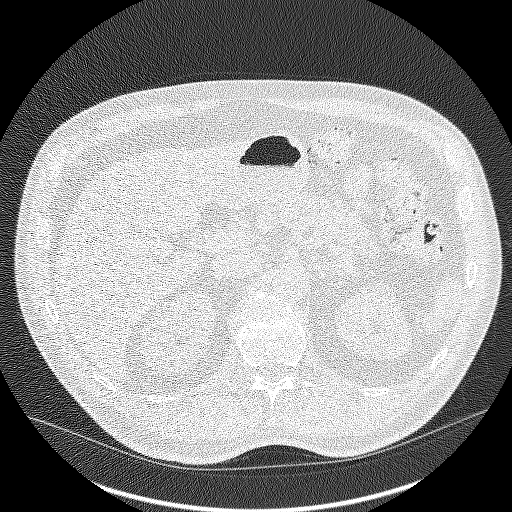
[im 49/342  lung]
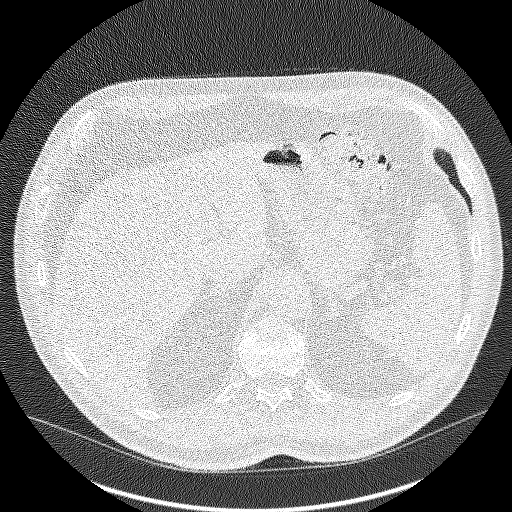
[im 82/342  lung]
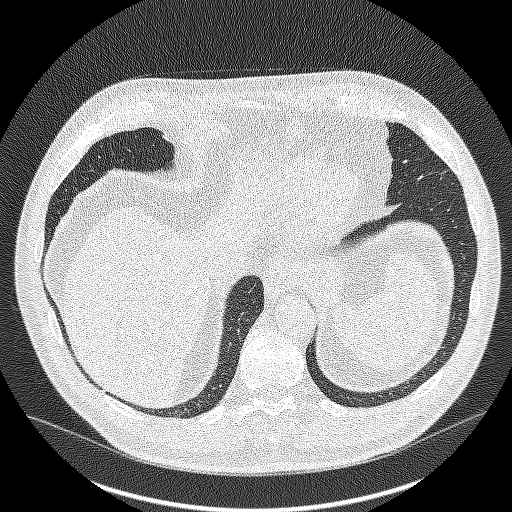
[im 98/342  lung]
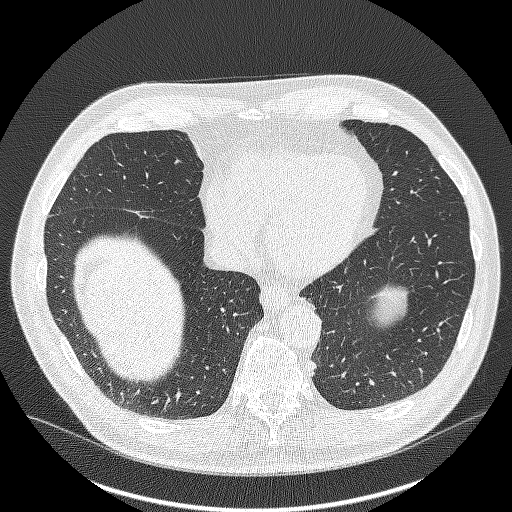
[im 130/342  mediastinal]
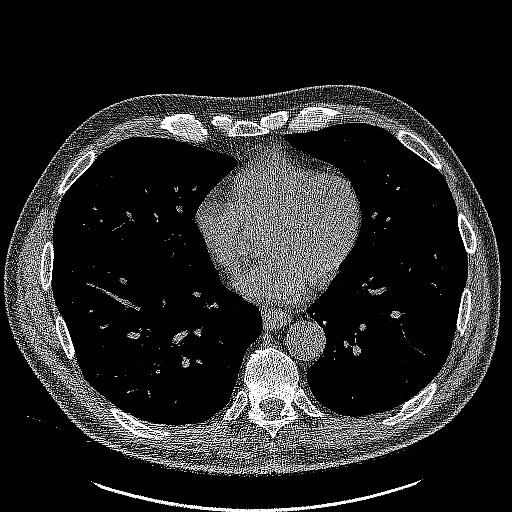
[im 130/342  lung]
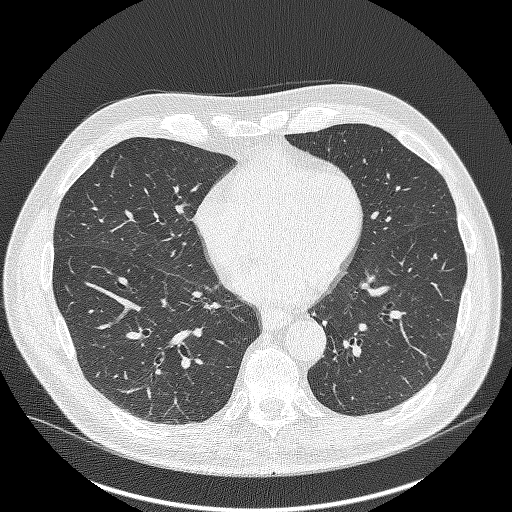
[im 163/342  lung]
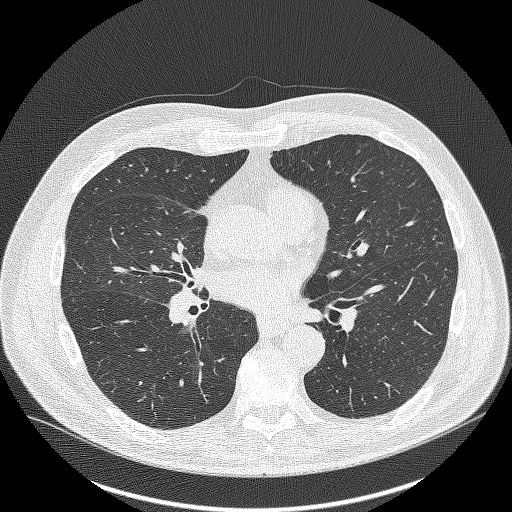
[im 179/342  lung]
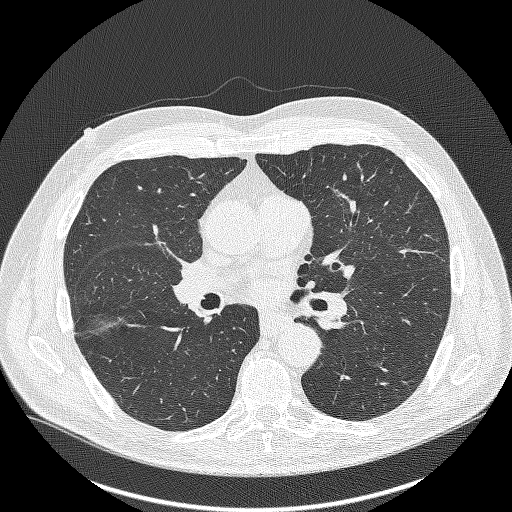
[im 212/342  lung]
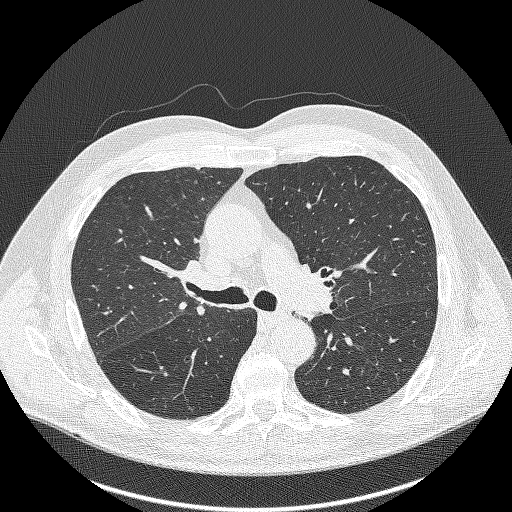
[im 244/342  mediastinal]
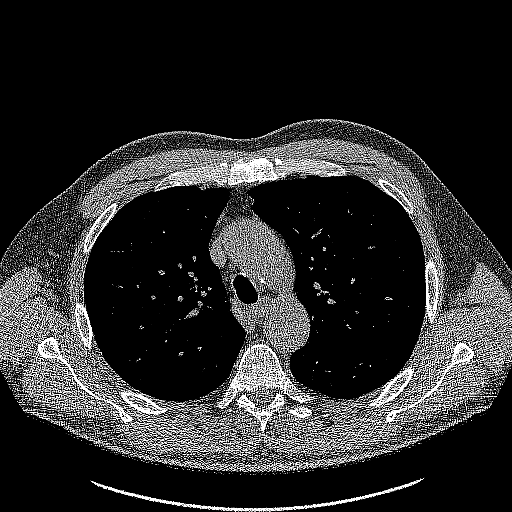
[im 244/342  lung]
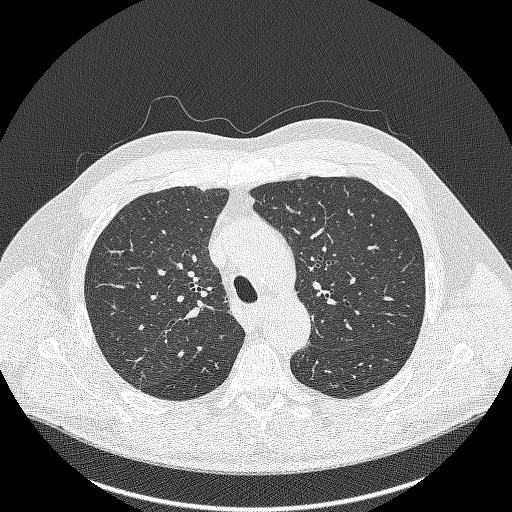
[im 260/342  lung]
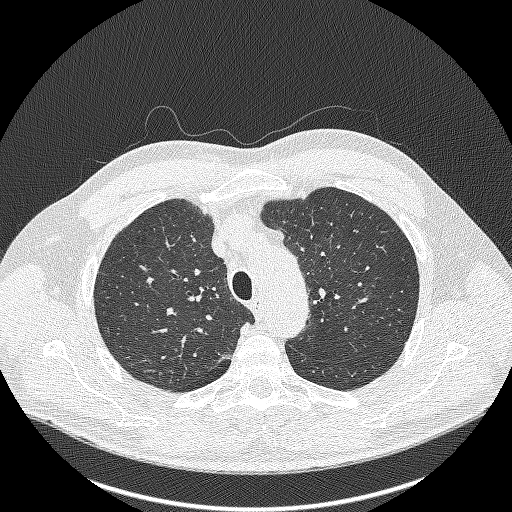
[im 293/342  lung]
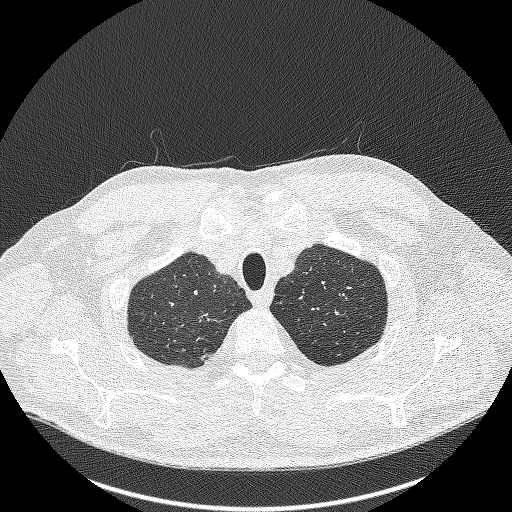
[im 325/342  lung]
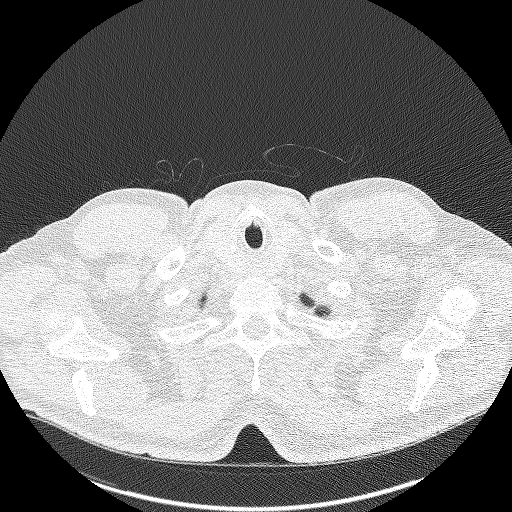

[Series 4: coronals lung 1.00 cor · coronal · 0.67mm/px · 3 of 291 slices shown]
[im 59/291  lung]
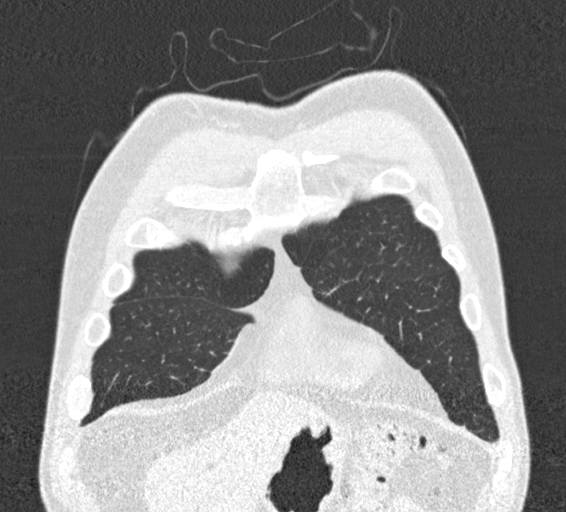
[im 117/291  lung]
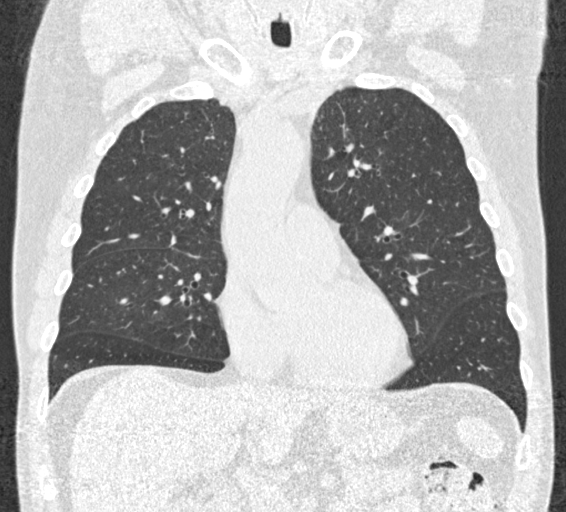
[im 175/291  lung]
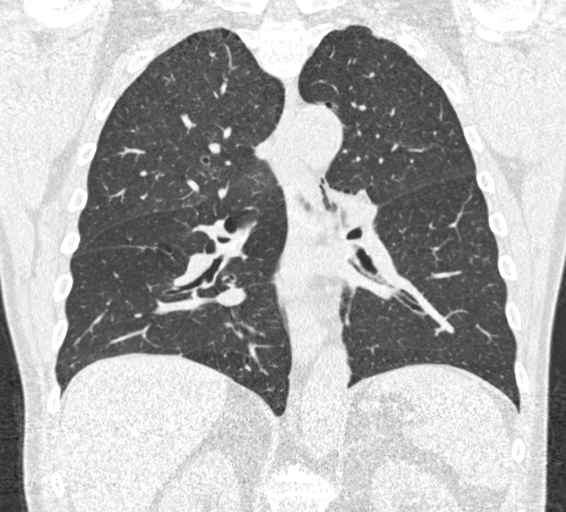

[15 of 40 positions shown; findings below may reference images not displayed]

FINDINGS: Cardiovascular: The heart size appears normal. No pericardial
effusion. Mild aortic atherosclerosis. Lad, left circumflex coronary
artery calcifications.

Mediastinum/Nodes: No enlarged mediastinal, hilar, or axillary lymph
nodes. Thyroid gland, trachea, and esophagus demonstrate no
significant findings.

Lungs/Pleura: No pleural effusion. No airspace consolidation,
atelectasis or pneumothorax. Paraseptal and centrilobular emphysema.
Multiple small pulmonary nodules are again noted within both lungs.
The largest lung nodule is in the subpleural aspect of the
paravertebral left lower lobe with equivalent diameter of 7.5 mm.

Upper Abdomen: No acute abnormality.

Musculoskeletal: Multilevel degenerative disc disease noted in the
thoracic spine. No acute or suspicious osseous findings.
IMPRESSION: 1. Lung-RADS 2, benign appearance or behavior. Continue annual
screening with low-dose chest CT without contrast in 12 months.
2. Emphysema and aortic atherosclerosis. Three vessel coronary
artery calcifications noted.

Aortic Atherosclerosis (32ZNC-ZKQ.Q) and Emphysema (32ZNC-RU5.0).

## 2022-01-23 ENCOUNTER — Telehealth: Payer: Self-pay | Admitting: *Deleted

## 2022-01-23 NOTE — Telephone Encounter (Signed)
Left message to call to schedule follow up Lung CA CT Screening Scan.

## 2022-03-07 ENCOUNTER — Other Ambulatory Visit: Payer: Self-pay | Admitting: *Deleted

## 2022-03-07 DIAGNOSIS — F17210 Nicotine dependence, cigarettes, uncomplicated: Secondary | ICD-10-CM

## 2022-03-07 DIAGNOSIS — Z87891 Personal history of nicotine dependence: Secondary | ICD-10-CM

## 2022-03-07 DIAGNOSIS — Z122 Encounter for screening for malignant neoplasm of respiratory organs: Secondary | ICD-10-CM

## 2022-03-20 ENCOUNTER — Ambulatory Visit
Admission: RE | Admit: 2022-03-20 | Discharge: 2022-03-20 | Disposition: A | Discharge status: Home / Self Care | Payer: Medicare PPO | Source: Ambulatory Visit | Attending: Acute Care | Admitting: Acute Care

## 2022-03-20 DIAGNOSIS — F17210 Nicotine dependence, cigarettes, uncomplicated: Secondary | ICD-10-CM | POA: Diagnosis present

## 2022-03-20 DIAGNOSIS — Z87891 Personal history of nicotine dependence: Secondary | ICD-10-CM | POA: Diagnosis present

## 2022-03-20 DIAGNOSIS — Z122 Encounter for screening for malignant neoplasm of respiratory organs: Secondary | ICD-10-CM | POA: Diagnosis present

## 2022-03-22 ENCOUNTER — Telehealth: Payer: Self-pay | Admitting: Acute Care

## 2022-03-22 DIAGNOSIS — Z122 Encounter for screening for malignant neoplasm of respiratory organs: Secondary | ICD-10-CM

## 2022-03-22 DIAGNOSIS — R918 Other nonspecific abnormal finding of lung field: Secondary | ICD-10-CM

## 2022-03-22 DIAGNOSIS — Z87891 Personal history of nicotine dependence: Secondary | ICD-10-CM

## 2022-03-22 DIAGNOSIS — F17210 Nicotine dependence, cigarettes, uncomplicated: Secondary | ICD-10-CM

## 2022-03-22 NOTE — Telephone Encounter (Signed)
I have attempted to call the patient with the results of their  Low Dose CT Chest Lung cancer screening scan. There was no answer. I have left a HIPPA compliant VM requesting the patient call the office for the scan results. I included the office contact information in the message. We will await his return call. If no return call we will continue to call until patient is contacted.   

## 2022-03-22 NOTE — Telephone Encounter (Signed)
See other telephone note from 03/22/22

## 2022-03-23 NOTE — Telephone Encounter (Signed)
Contacted patient by phone, using two patient identifiers, to review results of recent LDCT.  Patient has reviewed results in my chart. Two new nodules identified since last CT. Recommendation to repeat CT chest in 3 months to monitor nodule for any changes.  Patient states he has had more congestion and cough in his lungs past few months.  Has not seen provider about it.  Advised if yellow or green phlegm or any sign of infection such as fever, malaise, etc. To follow up with PCP for  care. Emphysema and atherosclerosis, as previously noted.  Patient is not on statin medication but will discuss with PCP.  Patient agrees with follow up scan 3 months.  Order placed for CT and results w/plan faxed to PCP.

## 2022-06-19 ENCOUNTER — Ambulatory Visit: Admission: RE | Admit: 2022-06-19 | Payer: Medicare PPO | Source: Ambulatory Visit

## 2023-02-08 DEATH — deceased
